# Patient Record
Sex: Female | Born: 1990 | Hispanic: Yes | Marital: Single | Smoking: Never smoker
Health system: Southern US, Community
[De-identification: ages and names within clinical notes are randomized; demographics above are authoritative.]

## PROBLEM LIST (undated history)

## (undated) DIAGNOSIS — J45909 Unspecified asthma, uncomplicated: Secondary | ICD-10-CM

## (undated) DIAGNOSIS — K219 Gastro-esophageal reflux disease without esophagitis: Secondary | ICD-10-CM

## (undated) DIAGNOSIS — N189 Chronic kidney disease, unspecified: Secondary | ICD-10-CM

## (undated) HISTORY — PX: FINGER SURGERY: SHX640

## (undated) HISTORY — DX: Gastro-esophageal reflux disease without esophagitis: K21.9

---

## 2016-03-30 ENCOUNTER — Other Ambulatory Visit (HOSPITAL_COMMUNITY): Payer: Self-pay | Admitting: *Deleted

## 2016-03-30 ENCOUNTER — Encounter (HOSPITAL_COMMUNITY): Payer: Self-pay | Admitting: *Deleted

## 2016-03-30 DIAGNOSIS — N6320 Unspecified lump in the left breast, unspecified quadrant: Secondary | ICD-10-CM

## 2016-04-23 ENCOUNTER — Ambulatory Visit (HOSPITAL_COMMUNITY)
Admission: RE | Admit: 2016-04-23 | Discharge: 2016-04-23 | Disposition: A | Discharge status: Home / Self Care | Payer: Self-pay | Source: Ambulatory Visit | Attending: Obstetrics and Gynecology | Admitting: Obstetrics and Gynecology

## 2016-04-23 ENCOUNTER — Encounter (HOSPITAL_COMMUNITY): Payer: Self-pay

## 2016-04-23 ENCOUNTER — Ambulatory Visit
Admission: RE | Admit: 2016-04-23 | Discharge: 2016-04-23 | Disposition: A | Discharge status: Home / Self Care | Payer: Self-pay | Source: Ambulatory Visit | Attending: Obstetrics and Gynecology | Admitting: Obstetrics and Gynecology

## 2016-04-23 VITALS — BP 108/62 | Temp 98.0°F | Ht 62.0 in | Wt 162.6 lb

## 2016-04-23 DIAGNOSIS — Z1239 Encounter for other screening for malignant neoplasm of breast: Secondary | ICD-10-CM

## 2016-04-23 DIAGNOSIS — N6320 Unspecified lump in the left breast, unspecified quadrant: Secondary | ICD-10-CM

## 2016-04-23 NOTE — Patient Instructions (Signed)
Educational materials on self breast awareness given. Explained to Sander Radon that she did not need a Pap smear today due to last Pap smear was 03/20/2016.  Let her know BCCCP will cover Pap smears every 3 years unless has a history of abnormal Pap smears. Referred patient to the [facility] for a left breast ultrasound. Appointment scheduled for Thursday, Apr 23, 2016 at 1600. [place] verbalized understanding.  Claxton Levitz, Arvil Chaco, RN 4:09 PM

## 2016-04-23 NOTE — Progress Notes (Addendum)
Complaints of left breast lump x 3 years that hasn't changed in size. Patient complained of pain within the the left breast where lump is during her menstrual cycle and when she gets stressed.   Pap Smear:  Pap smear not completed today. Last Pap smear was 03/20/2016 at the [facility] Department and normal. Per patient has no history of an abnormal Pap smear. Last Pap smear result is in EPIC.  Physical exam: Breasts Breasts symmetrical. No skin abnormalities bilateral breasts. No nipple retraction bilateral breasts. No nipple discharge bilateral breasts. No lymphadenopathy. No lumps palpated right breast. Palpated a lump within the left breast at 12 o'clock 5 cm from the nipple. Complaints of tenderness when palpated lump. Referred patient to the [facility] for a left breast ultrasound. Appointment scheduled for Thursday, Apr 23, 2016 at 1600.   Pelvic/Bimanual No Pap smear completed today since last Pap smear was 03/20/2016. Pap smear not indicated per BCCCP guidelines.   Smoking History: Patient has never smoked.  Patient Navigation: Patient education provided. Access to services provided for patient through [facility] program. Spanish interpreter provided.    Used Spanish interpreter Windle Guard from [place].

## 2016-04-24 ENCOUNTER — Other Ambulatory Visit: Payer: Self-pay

## 2016-05-01 ENCOUNTER — Encounter (HOSPITAL_COMMUNITY): Payer: Self-pay | Admitting: *Deleted

## 2016-05-27 NOTE — Addendum Note (Signed)
Encounter addended by: Loletta Parish, RN on: 05/27/2016 10:34 AM<BR>     Documentation filed: Notes Section

## 2017-12-16 ENCOUNTER — Encounter (HOSPITAL_COMMUNITY): Payer: Self-pay | Admitting: Emergency Medicine

## 2017-12-16 ENCOUNTER — Emergency Department (HOSPITAL_COMMUNITY)
Admission: EM | Admit: 2017-12-16 | Discharge: 2017-12-16 | Disposition: A | Discharge status: Home / Self Care | Payer: No Typology Code available for payment source | Attending: Emergency Medicine | Admitting: Emergency Medicine

## 2017-12-16 ENCOUNTER — Other Ambulatory Visit: Payer: Self-pay

## 2017-12-16 DIAGNOSIS — R112 Nausea with vomiting, unspecified: Secondary | ICD-10-CM

## 2017-12-16 DIAGNOSIS — K529 Noninfective gastroenteritis and colitis, unspecified: Secondary | ICD-10-CM

## 2017-12-16 DIAGNOSIS — R197 Diarrhea, unspecified: Secondary | ICD-10-CM

## 2017-12-16 LAB — URINALYSIS, ROUTINE W REFLEX MICROSCOPIC
Glucose, UA: NEGATIVE mg/dL
HGB URINE DIPSTICK: NEGATIVE
KETONES UR: 15 mg/dL — AB
Nitrite: POSITIVE — AB
PROTEIN: NEGATIVE mg/dL
Specific Gravity, Urine: >1.03 — ABNORMAL HIGH (ref 1.005–1.030)
pH: 6 (ref 5.0–8.0)

## 2017-12-16 LAB — URINALYSIS, MICROSCOPIC (REFLEX)

## 2017-12-16 LAB — COMPREHENSIVE METABOLIC PANEL
ALK PHOS: 73 U/L (ref 38–126)
ALT: 100 U/L — AB (ref 14–54)
AST: 52 U/L — AB (ref 15–41)
Albumin: 4.3 g/dL (ref 3.5–5.0)
Anion gap: 10 (ref 5–15)
BUN: 5 mg/dL — AB (ref 6–20)
CALCIUM: 9.7 mg/dL (ref 8.9–10.3)
CHLORIDE: 105 mmol/L (ref 101–111)
CO2: 24 mmol/L (ref 22–32)
CREATININE: 0.69 mg/dL (ref 0.44–1.00)
Glucose, Bld: 88 mg/dL (ref 65–99)
Potassium: 3.7 mmol/L (ref 3.5–5.1)
Sodium: 139 mmol/L (ref 135–145)
Total Bilirubin: 1.2 mg/dL (ref 0.3–1.2)
Total Protein: 7.9 g/dL (ref 6.5–8.1)

## 2017-12-16 LAB — CBC
HCT: 40.7 % (ref 36.0–46.0)
Hemoglobin: 13.1 g/dL (ref 12.0–15.0)
MCH: 29.6 pg (ref 26.0–34.0)
MCHC: 32.2 g/dL (ref 30.0–36.0)
MCV: 91.9 fL (ref 78.0–100.0)
PLATELETS: 368 10*3/uL (ref 150–400)
RBC: 4.43 MIL/uL (ref 3.87–5.11)
RDW: 12.7 % (ref 11.5–15.5)
WBC: 5.7 10*3/uL (ref 4.0–10.5)

## 2017-12-16 LAB — I-STAT BETA HCG BLOOD, ED (MC, WL, AP ONLY)

## 2017-12-16 LAB — LIPASE, BLOOD: LIPASE: 28 U/L (ref 11–51)

## 2017-12-16 MED ORDER — SODIUM CHLORIDE 0.9 % IV BOLUS (SEPSIS)
1000.0000 mL | Freq: Once | INTRAVENOUS | Status: AC
  Administered 2017-12-16: 1000 mL via INTRAVENOUS

## 2017-12-16 MED ORDER — ONDANSETRON 4 MG PO TBDP: ORAL_TABLET | ORAL | 0 refills | Status: DC

## 2017-12-16 MED ORDER — OMEPRAZOLE 20 MG PO CPDR: 20.0000 mg | DELAYED_RELEASE_CAPSULE | Freq: Every day | ORAL | 0 refills | Status: DC

## 2017-12-16 MED ORDER — PANTOPRAZOLE SODIUM 40 MG IV SOLR
40.0000 mg | Freq: Once | INTRAVENOUS | Status: AC
  Administered 2017-12-16: 40 mg via INTRAVENOUS
  Filled 2017-12-16: qty 40

## 2017-12-16 MED ORDER — GI COCKTAIL ~~LOC~~
30.0000 mL | Freq: Once | ORAL | Status: AC
  Administered 2017-12-16: 30 mL via ORAL
  Filled 2017-12-16: qty 30

## 2017-12-16 MED ORDER — ONDANSETRON HCL 4 MG/2ML IJ SOLN
4.0000 mg | Freq: Once | INTRAMUSCULAR | Status: AC
  Administered 2017-12-16: 4 mg via INTRAVENOUS
  Filled 2017-12-16: qty 2

## 2017-12-16 NOTE — ED Provider Notes (Signed)
[facility] EMERGENCY DEPARTMENT Provider Note   CSN: [id number] Arrival date & time: 12/16/17  6144     History   Chief Complaint Chief Complaint  Patient presents with  . Emesis  . Abdominal Pain    HPI  Alexa Alvarez is a 27 y.o. Female otherwise healthy, presents to the ED complaining of 4 days of nausea, vomiting and no appetite, patient reports some associated nonbloody diarrhea, denies any melena.  Reports yesterday and today she started to have epigastric burning and pain, denies any abdominal pain elsewhere.  That has not done anything to treat her symptoms at home, has been able to drink fluids but has not tried to eat anything in the past 4 days.  Unsure if symptoms are made worse with food as patient really has not tried to eat.  Patient denies any lower abdominal pain, denies any burning or discomfort with urination.  Patient denies any vaginal discharge.  Reports she is been having regular periods.  Denies any associated chest pain or shortness of breath, no fevers, cough, rhinorrhea or sore throat.      History reviewed. No pertinent past medical history.  There are no active problems to display for this patient.   Past Surgical History:  Procedure Laterality Date  . CESAREAN SECTION      OB History    Gravida Para Term Preterm AB Living   1 1 1     1    SAB TAB Ectopic Multiple Live Births                   Home Medications    Prior to Admission medications   Not on File    Family History Family History  Problem Relation Age of Onset  . Diabetes Mother   . Hypertension Mother     Social History Social History   Tobacco Use  . Smoking status: Never Smoker  . Smokeless tobacco: Never Used  Substance Use Topics  . Alcohol use: No  . Drug use: No     Allergies   Patient has no known allergies.   Review of Systems Review of Systems  Constitutional: Positive for appetite change and fatigue. Negative for  chills and fever.  HENT: Negative for congestion, rhinorrhea and sore throat.   Eyes: Negative for discharge, redness and itching.  Respiratory: Negative for cough, chest tightness and shortness of breath.   Cardiovascular: Negative for chest pain, palpitations and leg swelling.  Gastrointestinal: Positive for abdominal pain, diarrhea, nausea and vomiting. Negative for blood in stool.  Genitourinary: Negative for dysuria, frequency, pelvic pain, vaginal bleeding, vaginal discharge and vaginal pain.  Musculoskeletal: Negative for arthralgias and myalgias.  Skin: Negative for rash.  Neurological: Negative for dizziness, weakness and light-headedness.     Physical Exam Updated Vital Signs BP 123/84 (BP Location: Right Arm)   Pulse 67   Temp 97.7 F (36.5 C) (Oral)   Resp 16   LMP 11/24/2017 (Exact Date)   SpO2 100%   Physical Exam  Constitutional: She appears well-developed and well-nourished. No distress.  HENT:  Head: Normocephalic and atraumatic.  Eyes: Right eye exhibits no discharge. Left eye exhibits no discharge.  Cardiovascular: Normal rate, regular rhythm and normal heart sounds.  Pulmonary/Chest: Effort normal and breath sounds normal. No respiratory distress.  Abdominal: Soft. Bowel sounds are normal. She exhibits no distension and no mass. There is no tenderness. There is no guarding.  No tenderness on palpation of all 4 quadrants  of the abdomen abdomen is soft and nondistended, specifically no tenderness on palpation of the epigastrium, no peritoneal signs or CVA tenderness  Musculoskeletal: She exhibits no edema or deformity.  Neurological: She is alert. Coordination normal.  Skin: Skin is warm and dry. Capillary refill takes less than 2 seconds. She is not diaphoretic.  Psychiatric: She has a normal mood and affect. Her behavior is normal.  Nursing note and vitals reviewed.    ED Treatments / Results  Labs (all labs ordered are listed, but only abnormal results  are displayed) Labs Reviewed  COMPREHENSIVE METABOLIC PANEL - Abnormal; Notable for the following components:      Result Value   BUN 5 (*)    AST 52 (*)    ALT 100 (*)    All other components within normal limits  URINALYSIS, ROUTINE W REFLEX MICROSCOPIC - Abnormal; Notable for the following components:   APPearance CLOUDY (*)    Specific Gravity, Urine >1.030 (*)    Bilirubin Urine SMALL (*)    Ketones, ur 15 (*)    Nitrite POSITIVE (*)    Leukocytes, UA SMALL (*)    All other components within normal limits  URINALYSIS, MICROSCOPIC (REFLEX) - Abnormal; Notable for the following components:   Bacteria, UA MANY (*)    Squamous Epithelial / LPF 6-30 (*)    All other components within normal limits  LIPASE, BLOOD  CBC  I-STAT BETA HCG BLOOD, ED ([facility], [facility], [facility] ONLY)    EKG  EKG Interpretation None       Radiology No results found.  Procedures Procedures (including critical care time)  Medications Ordered in ED Medications  sodium chloride 0.9 % bolus 1,000 mL (0 mLs Intravenous Stopped 12/16/17 1553)  pantoprazole (PROTONIX) injection 40 mg (40 mg Intravenous Given 12/16/17 1220)  ondansetron (ZOFRAN) injection 4 mg (4 mg Intravenous Given 12/16/17 1220)  gi cocktail (Maalox,Lidocaine,Donnatal) (30 mLs Oral Given 12/16/17 1221)     Initial Impression / Assessment and Plan / ED Course  I have reviewed the triage vital signs and the nursing notes.  Pertinent labs & imaging results that were available during my care of the patient were reviewed by me and considered in my medical decision making (see chart for details).  Patient presents with 4 days of decreased appetite, nausea, vomiting and diarrhea.  With some epigastric burning that is intermittent.  No medications to treat symptoms at home.  Vitals are normal and patient is well-appearing.  Abdomen is benign with no tenderness on exam no peritoneal signs.  No urinary symptoms, lower abdominal tenderness vaginal discharge  or pelvic pain. Likely viral gastroenteritis given reassuring labs.  No leukocytosis and hemoglobin is normal, no electrolyte derangements, kidney function normal, liver function studies are slightly increased from baseline but patient has no right upper quadrant tenderness will have patient follow-up with her primary doctor in a week to have these rechecked.  Urinalysis does not appear to be a clean catch, small amount of nitrates and leukocytes, but without any urinary symptoms will not treat as UTI.  Lipase normal.  Beta hCG negative.  Will treat with IV fluids, Zofran, Protonix and GI cocktail and reevaluate the patient.  Patient reports complete resolution of symptoms after fluids, Zofran, Protonix and GI cocktail.  Abdomen continues to be soft and nontender to palpation.  Presentation suggestive of gastroenteritis and labs are reassuring.  Will give patient p.o. trial if she is able to keep down food and liquids patient is stable for  discharge home and follow-up with her primary care doctor.  Strict return precautions discussed.  Patient expresses understanding and is in agreement with plan  Final Clinical Impressions(s) / ED Diagnoses   Final diagnoses:  Gastroenteritis  Nausea vomiting and diarrhea    ED Discharge Orders        Ordered    omeprazole (PRILOSEC) 20 MG capsule  Daily     12/16/17 1544    ondansetron (ZOFRAN ODT) 4 MG disintegrating tablet     12/16/17 1544       Benedetto Goad [place], [place] 12/17/17 9692    Fredia Sorrow, MD 12/18/17 [phone]

## 2017-12-16 NOTE — Discharge Instructions (Signed)
Your evaluation today has been reassuring, likely symptoms are caused by viral gastroenteritis or gastric reflux.  Please follow-up with primary doctor and [facility] or use the phone number provided to establish a PCP, you will need to have your liver function rechecked in about a week to ensure this has normalized.  Please start taking omeprazole daily, you may use Zofran as needed for nausea.  You may also use Culturelle this is an over-the-counter probiotic to help with diarrhea.  If your symptoms persist and you are unable to keep down liquids or food, or you have worsening focal abdominal pain, fevers or blood in the stool please return to the ED for reevaluation.

## 2017-12-16 NOTE — ED Triage Notes (Signed)
Pt reports no appetite x 4 days, reports diarrhea and some n/v, reports burning pain to stomach since yesterday.

## 2018-06-06 ENCOUNTER — Other Ambulatory Visit (HOSPITAL_COMMUNITY): Payer: Self-pay | Admitting: *Deleted

## 2018-06-06 DIAGNOSIS — N6320 Unspecified lump in the left breast, unspecified quadrant: Secondary | ICD-10-CM

## 2018-06-28 ENCOUNTER — Encounter (HOSPITAL_COMMUNITY): Payer: Self-pay

## 2018-06-28 ENCOUNTER — Ambulatory Visit
Admission: RE | Admit: 2018-06-28 | Discharge: 2018-06-28 | Disposition: A | Discharge status: Home / Self Care | Payer: No Typology Code available for payment source | Source: Ambulatory Visit | Attending: Obstetrics and Gynecology | Admitting: Obstetrics and Gynecology

## 2018-06-28 ENCOUNTER — Ambulatory Visit (HOSPITAL_COMMUNITY)
Admission: RE | Admit: 2018-06-28 | Discharge: 2018-06-28 | Disposition: A | Discharge status: Home / Self Care | Payer: No Typology Code available for payment source | Source: Ambulatory Visit | Attending: Obstetrics and Gynecology | Admitting: Obstetrics and Gynecology

## 2018-06-28 VITALS — BP 110/68 | Wt 153.2 lb

## 2018-06-28 DIAGNOSIS — N6320 Unspecified lump in the left breast, unspecified quadrant: Secondary | ICD-10-CM

## 2018-06-28 DIAGNOSIS — Z1239 Encounter for other screening for malignant neoplasm of breast: Secondary | ICD-10-CM

## 2018-06-28 NOTE — Progress Notes (Signed)
Complaints of two left breast lumps with one at 11 o'clock x 5 years and one at 4 o'clock x 2 weeks.   Pap Smear: Pap smear not completed today. Last Pap smear was 03/20/2016 at the [facility] Department and normal. Per patient has no history of an abnormal Pap smear. Last Pap smear result is in EPIC.  Physical exam: Breasts Breasts symmetrical. No skin abnormalities bilateral breasts. No nipple retraction bilateral breasts. No nipple discharge bilateral breasts. No lymphadenopathy. No lumps palpated within the right breast. Palpated two lumps within the left breast at 11:30 o'clock 4 cm from the nipple and at 4 o'clock 2 cm from the nipple. No complaints of pain or tenderness on exam. Referred patient to the [facility] for a left breast ultrasound. Appointment scheduled for Tuesday, June 28, 2018 at 1500.        Pelvic/Bimanual No Pap smear completed today since last Pap smear was 03/20/2016. Pap smear not indicated per BCCCP guidelines.   Smoking History: Patient has never smoked.  Patient Navigation: Patient education provided. Access to services provided for patient through [facility] program. Spanish interpreter provided.   Breast and Cervical Cancer Risk Assessment: Patient has no family history of breast cancer, known genetic mutations, or radiation treatment to the chest before age 37. Patient has no history of cervical dysplasia, immunocompromised, or DES exposure in-utero. Breast Cancer risk assessment completed. Unable to calculate patients breast cancer risk due to being under age 71.  Used Spanish interpreter [employer] from [place].

## 2018-06-28 NOTE — Patient Instructions (Signed)
Explained breast self awareness with Sander Radon. Patient did not need a Pap smear today due to last Pap smear was 03/20/2016. Let her know BCCCP will cover Pap smears every 3 years unless has a history of abnormal Pap smears and that her next Pap smear is due in April 2020. Referred patient to the [facility] for a left breast ultrasound. Appointment scheduled for Tuesday, June 28, 2018 at 1500. [place] verbalized understanding.  Malik Ruffino, Arvil Chaco, RN 12:46 PM

## 2018-07-01 ENCOUNTER — Encounter (HOSPITAL_COMMUNITY): Payer: Self-pay | Admitting: *Deleted

## 2018-11-17 ENCOUNTER — Encounter: Payer: Self-pay | Admitting: Family Medicine

## 2018-11-17 ENCOUNTER — Ambulatory Visit: Payer: Self-pay | Attending: Family Medicine | Admitting: Family Medicine

## 2018-11-17 VITALS — BP 137/82 | HR 64 | Temp 98.5°F | Resp 18 | Ht 64.0 in | Wt 162.0 lb

## 2018-11-17 DIAGNOSIS — K219 Gastro-esophageal reflux disease without esophagitis: Secondary | ICD-10-CM

## 2018-11-17 DIAGNOSIS — N3000 Acute cystitis without hematuria: Secondary | ICD-10-CM

## 2018-11-17 DIAGNOSIS — R350 Frequency of micturition: Secondary | ICD-10-CM

## 2018-11-17 DIAGNOSIS — R42 Dizziness and giddiness: Secondary | ICD-10-CM

## 2018-11-17 DIAGNOSIS — R5383 Other fatigue: Secondary | ICD-10-CM | POA: Insufficient documentation

## 2018-11-17 DIAGNOSIS — D649 Anemia, unspecified: Secondary | ICD-10-CM

## 2018-11-17 DIAGNOSIS — R110 Nausea: Secondary | ICD-10-CM

## 2018-11-17 DIAGNOSIS — Z833 Family history of diabetes mellitus: Secondary | ICD-10-CM

## 2018-11-17 LAB — POCT URINALYSIS DIP (CLINITEK)
Bilirubin, UA: NEGATIVE
Blood, UA: NEGATIVE
Glucose, UA: NEGATIVE mg/dL
Ketones, POC UA: NEGATIVE mg/dL
Nitrite, UA: POSITIVE — AB
Spec Grav, UA: 1.02
Urobilinogen, UA: 1 U/dL
pH, UA: 7

## 2018-11-17 LAB — POCT GLYCOSYLATED HEMOGLOBIN (HGB A1C): Hemoglobin A1C: 5.3 % (ref 4.0–5.6)

## 2018-11-17 LAB — GLUCOSE, POCT (MANUAL RESULT ENTRY): POC Glucose: 80 mg/dL (ref 70–99)

## 2018-11-17 MED ORDER — SULFAMETHOXAZOLE-TRIMETHOPRIM 800-160 MG PO TABS: 1.0000 | ORAL_TABLET | Freq: Two times a day (BID) | ORAL | 0 refills | Status: AC

## 2018-11-17 MED ORDER — OMEPRAZOLE 20 MG PO CPDR: 20.0000 mg | DELAYED_RELEASE_CAPSULE | Freq: Every day | ORAL | 3 refills | Status: DC

## 2018-11-17 NOTE — Progress Notes (Signed)
Subjective:    Patient ID: Alexa Alvarez, female    DOB: 10-11-1991, 27 y.o.   MRN: [id number]  HPI        27 yo female new to the practice. Patient reports that she has had recurrent issues with nausea as well as dizziness and feeling light-headed. Patient reports that she has a family history of diabetes and that she would like to be checked for diabetes. Patient has had some urinary frequency at times and lately has noticed a strong odor to her urine at times and sometimes her urine is dark.  Patient reports that her mother, as well as aunts and cousins have diabetes. Patient denies any issues with gestational diabetes.        Patient reports a past medical history of asthma in childhood but no symptoms as an adult. Patient has had anemia in the past. Patient only prescription medication at this time is birth control pills. Patient reports that she is in a committed relationship. She does not drink or smoke. Her only past surgery has been a C-section.       Patient has found that her nausea usuallly occurs when she lies down at night or after eating spicy or greasy foods. Patient does get some burping and belching at times and when lying down at night she gets backwash of fluid into her throat. She denies any abdominal pain and no chest pain.     Review of Systems  Constitutional: Positive for fatigue. Negative for chills and fever.  HENT: Negative for congestion, sore throat and trouble swallowing.   Eyes: Negative for photophobia and visual disturbance.  Respiratory: Negative for cough and shortness of breath.   Cardiovascular: Negative for chest pain, palpitations and leg swelling.  Gastrointestinal: Positive for nausea and vomiting. Negative for abdominal pain, constipation and diarrhea.  Endocrine: Positive for polyuria. Negative for polydipsia and polyphagia.  Genitourinary: Positive for frequency. Negative for dysuria and flank pain.  Musculoskeletal: Positive for back pain  (occasional). Negative for gait problem.  Neurological: Negative for dizziness and headaches.  Hematological: Negative for adenopathy. Does not bruise/bleed easily.       Objective:   Physical Exam Vitals signs and nursing note reviewed.  Constitutional:      Appearance: Normal appearance. She is normal weight.  HENT:     Right Ear: Tympanic membrane normal.     Left Ear: Tympanic membrane normal.     Nose: Nose normal.     Mouth/Throat:     Mouth: Mucous membranes are moist.     Pharynx: Oropharynx is clear. No oropharyngeal exudate.  Neck:     Musculoskeletal: Normal range of motion and neck supple. No muscular tenderness.  Cardiovascular:     Rate and Rhythm: Normal rate and regular rhythm.  Pulmonary:     Effort: Pulmonary effort is normal.     Breath sounds: Normal breath sounds.  Abdominal:     General: Abdomen is flat.     Palpations: Abdomen is soft.     Tenderness: There is no abdominal tenderness. There is no right CVA tenderness or left CVA tenderness.  Musculoskeletal: Normal range of motion.        General: No swelling or tenderness.     Right lower leg: No edema.     Left lower leg: No edema.  Lymphadenopathy:     Cervical: No cervical adenopathy.  Skin:    Findings: No bruising or rash.  Neurological:     General: No  focal deficit present.     Mental Status: She is alert.     Cranial Nerves: No cranial nerve deficit.    BP 137/82 (BP Location: Left Arm, Patient Position: Sitting, Cuff Size: Normal)   Pulse 64   Temp 98.5 F (36.9 C) (Oral)   Resp 18   Ht 5\' 4"  (1.626 m)   Wt 162 lb (73.5 kg)   LMP 10/31/2018   SpO2 100%   BMI 27.81 kg/m         Assessment & Plan:  1. Gastroesophageal reflux disease, esophagitis presence not specified Based on patient's symptoms she likely has acid reflux as the cause of her nausea. Prescription provided for patient to start once per day omeprazole 20 mg and discussed avoidance of spicy/greasy foods and foods  which tend to trigger her symptoms as well as avoidance of late night eating  2. Anemia, unspecified type Patient reports fatigue and a history of anemia. Will check CBC and notify patient if any therapy is needed based on the results - CBC with Differential  3. Acute cystitis without hematuria UA done due to patient's complaint's of urinary frequency, increased odor and dark color to the urine. Patient with abnormal [place] and will be placed on Septra DS twice per day for 3 days and is encouraged to remain well hydrated. - sulfamethoxazole-trimethoprim (BACTRIM DS,SEPTRA DS) 800-160 MG tablet; Take 1 tablet by mouth 2 (two) times daily for 3 days. To treat bladder infection  Dispense: 6 tablet; Refill: 0  4. Urinary frequency Patient  reports urinary frequency and a family history of DM and was asked to give a sample for a UA as well as Hgb A1c and glucose.  Patient with normal random glucose of 80 and normal Hgb A1c of 5.3. - POCT URINALYSIS DIP (CLINITEK) - HgB A1c - Glucose (CBG)  5. Family history of diabetes mellitus Patient reports a family history of diabetes and wanted to be checked for DM due to fatigue and urinary frequency. Patient with normal A1c of 5.3 and normal random glucose of 80 and is not diabetic.  - HgB A1c - Glucose (CBG)  An After Visit Summary was printed and given to the patient.  Return in about 6 weeks (around 12/29/2018) for GERD.

## 2018-11-17 NOTE — Patient Instructions (Signed)
Enfermedad de reflujo gastroesofgico en los adultos Gastroesophageal Reflux Disease, Adult El reflujo gastroesofgico (RGE) ocurre cuando el cido del estmago sube por el tubo que conecta la boca con el estmago (esfago). Normalmente, la comida baja por el esfago y se mantiene en el estmago, donde se la digiere. Cuando una persona tiene RGE, los alimentos y el cido estomacal suelen volver al esfago. Usted puede tener una enfermedad llamada enfermedad de reflujo gastroesofgico (ERGE) si el reflujo:  Sucede a menudo.  Causa sntomas frecuentes o muy intensos.  Causa problemas tales como dao en el esfago. Cuando esto ocurre, el esfago duele y se hincha (inflama). Con el tiempo, la ERGE puede ocasionar pequeos agujeros (lceras) en el revestimiento del esfago. Cules son las causas? Esta afeccin se debe a un problema en el msculo que se encuentra entre el esfago y [place]. Cuando este msculo est dbil o no es normal, no se cierra correctamente para impedir que los alimentos y el cido regresen del Paramedic. El msculo puede debilitarse debido a lo siguiente:  El consumo de [place].  [place].  Tener cierto tipo de hernia (hernia de hiato).  Consumo de alcohol.  Ciertos alimentos y bebidas, como caf, chocolate, cebollas y [place]. Qu incrementa el riesgo? Es ms probable que tenga esta afeccin si:  Tiene sobrepeso.  Tiene una enfermedad que afecta el tejido conjuntivo.  [place] antiinflamatorios no esteroideos (AINE). Cules son los signos o los sntomas? Los sntomas de esta afeccin incluyen:  Acidez estomacal.  Dificultad o dolor al tragar.  Sensacin de Best boy un bulto en la garganta.  Sabor amargo en la boca.  Mal aliento.  Tener una gran cantidad de saliva.  Estmago inflamado o con Tree surgeon.  Eructos.  Dolor en el pecho. El dolor de pecho puede deberse a distintas afecciones. Asegrese de Teacher, adult education a su mdico si tiene Tourist information centre manager.  Falta  de aire o respiracin ruidosa (sibilancias).  Tos constante (crnica) o durante la noche.  Desgaste de la superficie de los dientes (esmalte dental).  Prdida de peso. Cmo se trata? El tratamiento depender de la gravedad de los sntomas. El mdico puede sugerirle lo siguiente:  Cambios en la dieta.  Medicamentos.  Clementeen Hoof. Siga estas indicaciones en su casa: Comida y bebida   Siga una dieta como se lo haya indicado el mdico. Es posible que deba evitar alimentos y bebidas, por ejemplo: ? Caf y t (con o sin cafena). ? Bebidas que contengan alcohol. ? Bebidas energticas y deportivas. ? Bebidas gaseosas y refrescos. ? Chocolate y cacao. ? Menta y [place]. ? Ajo y cebolla. ? Rbano picante. ? Alimentos cidos y condimentados. Estos incluyen todos los tipos de pimientos, [place] en polvo, curry en polvo, vinagre, salsas picantes y [employer]. ? Ctricos y sus jugos, por ejemplo, naranjas, limones y limas. ? Alimentos que [employer]. Estos incluyen salsa roja, [place], salsa picante y pizza con salsa de [place]. ? Alimentos fritos y Radio broadcast assistant. Estos incluyen donas, papas fritas, papitas fritas de bolsa y aderezos con alto contenido de [place]. ? Carnes con alto contenido de [place]. Estas incluye los perros calientes, chuletas o costillas, embutidos, jamn y tocino. ? Productos lcteos ricos en grasas, como leche [place], [place] y [place] crema.  Consuma pequeas cantidades de comida con ms frecuencia. Evite consumir porciones abundantes.  Evite beber grandes cantidades de lquidos con las comidas.  Evite comer 2 o 3horas antes de acostarse.  Evite recostarse inmediatamente despus de comer.  No haga ejercicios  enseguida despus de comer. Estilo de vida   No consuma ningn producto que contenga nicotina o tabaco. Estos incluyen cigarrillos, cigarrillos electrnicos y tabaco para Higher education careers adviser. Si necesita ayuda para dejar de fumar, consulte al [employer].  Intente  reducir [employer] de estrs. Si necesita ayuda para hacer esto, consulte al mdico.  Si tiene sobrepeso, baje una cantidad de peso saludable para usted. Consulte a su mdico para bajar de peso de [employer]. Indicaciones generales  Est atento a cualquier cambio en los sntomas.  Tome los medicamentos de venta libre y los recetados solamente como se lo haya indicado el mdico. No tome aspirina, ibuprofeno ni otros AINE a menos que el mdico lo autorice.  Use ropa holgada. No use nada apretado alrededor de la cintura.  Levante (eleve) la cabecera de la cama aproximadamente 6pulgadas (15cm).  Evite inclinarse si al hacerlo empeoran los sntomas.  Concurra a todas las visitas de seguimiento como se lo haya indicado el mdico. Esto es importante. Comunquese con un mdico si:  Aparecen nuevos sntomas.  Adelgaza y no sabe por qu.  Tiene problemas para tragar o le duele cuando traga.  Tiene sibilancias o tos persistente.  Los sntomas no mejoran con Dispensing optician.  Tiene la voz ronca. Solicite ayuda inmediatamente si:  [employer], el cuello, la [place], los dientes o la espalda.  Se siente transpirado, mareado o tiene una sensacin de desvanecimiento.  Siente falta de aire o Tourist information centre manager.  Vomita y el vmito tiene un aspecto similar a la sangre o a los posos de caf.  Pierde el conocimiento (se desmaya).  Las deposiciones (heces) son sanguinolentas o negras.  No puede tragar, beber o comer. Resumen  Si una persona tiene enfermedad de reflujo gastroesofgico (ERGE), los alimentos y el cido estomacal suben al esfago y causan sntomas o problemas tales como dao en el esfago.  El tratamiento depender de la gravedad de los sntomas.  Siga una Lexicographer se lo haya indicado el [place] todos los medicamentos solamente como se lo haya indicado el mdico. Esta informacin no tiene Marine scientist el consejo del mdico. Asegrese de  hacerle al mdico cualquier pregunta que tenga. Document Released: 12/19/2010 Document Revised: 06/30/2018 Document Reviewed: 06/30/2018 Elsevier Interactive Patient Education  2019 [place] de reflujo gastroesofgico en los adultos Gastroesophageal Reflux Disease, Adult El reflujo gastroesofgico (RGE) ocurre cuando el cido del estmago sube por el tubo que conecta la boca con el estmago (esfago). Normalmente, la comida baja por el esfago y se mantiene en el estmago, donde se la digiere. Cuando una persona tiene RGE, los alimentos y el cido estomacal suelen volver al esfago. Usted puede tener una enfermedad llamada enfermedad de reflujo gastroesofgico (ERGE) si el reflujo:  Sucede a menudo.  Causa sntomas frecuentes o muy intensos.  Causa problemas tales como dao en el esfago. Cuando esto ocurre, el esfago duele y se hincha (inflama). Con el tiempo, la ERGE puede ocasionar pequeos agujeros (lceras) en el revestimiento del esfago. Cules son las causas? Esta afeccin se debe a un problema en el msculo que se encuentra entre el esfago y [place]. Cuando este msculo est dbil o no es normal, no se cierra correctamente para impedir que los alimentos y el cido regresen del Paramedic. El msculo puede debilitarse debido a lo siguiente:  El consumo de [place].  [place].  Tener cierto tipo de hernia (hernia de hiato).  Consumo de alcohol.  Ciertos alimentos  y bebidas, como caf, chocolate, cebollas y [place]. Qu incrementa el riesgo? Es ms probable que tenga esta afeccin si:  Tiene sobrepeso.  Tiene una enfermedad que afecta el tejido conjuntivo.  [place] antiinflamatorios no esteroideos (AINE). Cules son los signos o los sntomas? Los sntomas de esta afeccin incluyen:  Acidez estomacal.  Dificultad o dolor al tragar.  Sensacin de Best boy un bulto en la garganta.  Sabor amargo en la boca.  Mal aliento.  Tener una gran cantidad de  saliva.  Estmago inflamado o con Tree surgeon.  Eructos.  Dolor en el pecho. El dolor de pecho puede deberse a distintas afecciones. Asegrese de Teacher, adult education a su mdico si tiene Tourist information centre manager.  Falta de aire o respiracin ruidosa (sibilancias).  Tos constante (crnica) o durante la noche.  Desgaste de la superficie de los dientes (esmalte dental).  Prdida de peso. Cmo se trata? El tratamiento depender de la gravedad de los sntomas. El mdico puede sugerirle lo siguiente:  Cambios en la dieta.  Medicamentos.  Clementeen Hoof. Siga estas indicaciones en su casa: Comida y bebida   Siga una dieta como se lo haya indicado el mdico. Es posible que deba evitar alimentos y bebidas, por ejemplo: ? Caf y t (con o sin cafena). ? Bebidas que contengan alcohol. ? Bebidas energticas y deportivas. ? Bebidas gaseosas y refrescos. ? Chocolate y cacao. ? Menta y [place]. ? Ajo y cebolla. ? Rbano picante. ? Alimentos cidos y condimentados. Estos incluyen todos los tipos de pimientos, [place] en polvo, curry en polvo, vinagre, salsas picantes y [employer]. ? Ctricos y sus jugos, por ejemplo, naranjas, limones y limas. ? Alimentos que [employer]. Estos incluyen salsa roja, [place], salsa picante y pizza con salsa de [place]. ? Alimentos fritos y Radio broadcast assistant. Estos incluyen donas, papas fritas, papitas fritas de bolsa y aderezos con alto contenido de [place]. ? Carnes con alto contenido de [place]. Estas incluye los perros calientes, chuletas o costillas, embutidos, jamn y tocino. ? Productos lcteos ricos en grasas, como leche [place], [place] y [place] crema.  Consuma pequeas cantidades de comida con ms frecuencia. Evite consumir porciones abundantes.  Evite beber grandes cantidades de lquidos con las comidas.  Evite comer 2 o 3horas antes de acostarse.  Evite recostarse inmediatamente despus de comer.  No haga ejercicios enseguida despus de comer. Estilo de  vida   No consuma ningn producto que contenga nicotina o tabaco. Estos incluyen cigarrillos, cigarrillos electrnicos y tabaco para Higher education careers adviser. Si necesita ayuda para dejar de fumar, consulte al [employer].  Intente reducir [employer] de estrs. Si necesita ayuda para hacer esto, consulte al mdico.  Si tiene sobrepeso, baje una cantidad de peso saludable para usted. Consulte a su mdico para bajar de peso de [employer]. Indicaciones generales  Est atento a cualquier cambio en los sntomas.  Tome los medicamentos de venta libre y los recetados solamente como se lo haya indicado el mdico. No tome aspirina, ibuprofeno ni otros AINE a menos que el mdico lo autorice.  Use ropa holgada. No use nada apretado alrededor de la cintura.  Levante (eleve) la cabecera de la cama aproximadamente 6pulgadas (15cm).  Evite inclinarse si al hacerlo empeoran los sntomas.  Concurra a todas las visitas de seguimiento como se lo haya indicado el mdico. Esto es importante. Comunquese con un mdico si:  Aparecen nuevos sntomas.  Adelgaza y no sabe por qu.  Tiene problemas para tragar o le duele cuando traga.  Tiene sibilancias o tos persistente.  Los sntomas no mejoran con Dispensing optician.  Tiene la voz ronca. Solicite ayuda inmediatamente si:  [employer], el cuello, la [place], los dientes o la espalda.  Se siente transpirado, mareado o tiene una sensacin de desvanecimiento.  Siente falta de aire o Tourist information centre manager.  Vomita y el vmito tiene un aspecto similar a la sangre o a los posos de caf.  Pierde el conocimiento (se desmaya).  Las deposiciones (heces) son sanguinolentas o negras.  No puede tragar, beber o comer. Resumen  Si una persona tiene enfermedad de reflujo gastroesofgico (ERGE), los alimentos y el cido estomacal suben al esfago y causan sntomas o problemas tales como dao en el esfago.  El tratamiento depender de la gravedad de los  sntomas.  Siga una Lexicographer se lo haya indicado el [place] todos los medicamentos solamente como se lo haya indicado el mdico. Esta informacin no tiene Marine scientist el consejo del mdico. Asegrese de hacerle al mdico cualquier pregunta que tenga. Document Released: 12/19/2010 Document Revised: 06/30/2018 Document Reviewed: 06/30/2018 Elsevier Interactive Patient Education  2019 [employer].

## 2018-11-18 LAB — CBC WITH DIFFERENTIAL/PLATELET
Basophils Absolute: 0 x10E3/uL (ref 0.0–0.2)
Basos: 0 %
EOS (ABSOLUTE): 0.1 x10E3/uL (ref 0.0–0.4)
Eos: 1 %
Hematocrit: 39.2 % (ref 34.0–46.6)
Hemoglobin: 12.9 g/dL (ref 11.1–15.9)
Immature Grans (Abs): 0 x10E3/uL (ref 0.0–0.1)
Immature Granulocytes: 0 %
Lymphocytes Absolute: 2.6 x10E3/uL (ref 0.7–3.1)
Lymphs: 41 %
MCH: 29.3 pg (ref 26.6–33.0)
MCHC: 32.9 g/dL (ref 31.5–35.7)
MCV: 89 fL (ref 79–97)
Monocytes Absolute: 0.5 x10E3/uL (ref 0.1–0.9)
Monocytes: 8 %
Neutrophils Absolute: 3.1 x10E3/uL (ref 1.4–7.0)
Neutrophils: 50 %
Platelets: 378 x10E3/uL (ref 150–450)
RBC: 4.4 x10E6/uL (ref 3.77–5.28)
RDW: 11.8 % — ABNORMAL LOW (ref 12.3–15.4)
WBC: 6.3 x10E3/uL (ref 3.4–10.8)

## 2018-12-29 ENCOUNTER — Ambulatory Visit: Payer: Self-pay | Attending: Family Medicine | Admitting: Family Medicine

## 2018-12-29 ENCOUNTER — Encounter: Payer: Self-pay | Admitting: Family Medicine

## 2018-12-29 VITALS — BP 106/74 | HR 82 | Temp 98.0°F | Ht 64.0 in | Wt 158.0 lb

## 2018-12-29 DIAGNOSIS — R42 Dizziness and giddiness: Secondary | ICD-10-CM

## 2018-12-29 DIAGNOSIS — K219 Gastro-esophageal reflux disease without esophagitis: Secondary | ICD-10-CM

## 2018-12-29 DIAGNOSIS — R748 Abnormal levels of other serum enzymes: Secondary | ICD-10-CM

## 2018-12-29 NOTE — Progress Notes (Signed)
Follow up GERD, Patient denies any discomfort. Feels fine

## 2018-12-29 NOTE — Progress Notes (Signed)
Subjective:    Patient ID: Alexa Alvarez, female    DOB: [birth year], 28 y.o.   MRN: [id number]  HPI       28 yo female seen in follow-up of GERD which she reports has improved with the use of omeprazole. Patient also had elevated liver enzymes on blood work done in Jan of 2019 when she was seen at the ED due to the complaint of nausea and vomiting and was diagnosed with gastroenteritis.  Patient denies any recent abdominal pain, no nausea and vomiting and no blood in the stool or dark stools.  Patient has had no diarrhea, loose stools and no light or clay colored stools.    Patient at today's visit has new complaint of recurrent dizziness.  Patient sometimes feels as if she is off balance when she has onset of dizziness.  Patient denies any ear pain, no nasal congestion and no headaches.  Patient reports that the dizziness occurs randomly.  Patient denies any episodes of focal numbness or weakness and no visual changes.  Patient has had no falls and no history of injury to the head or neck.  Past Medical History:  Diagnosis Date  . GERD (gastroesophageal reflux disease)    Past Surgical History:  Procedure Laterality Date  . CESAREAN SECTION     Family History  Problem Relation Age of Onset  . Diabetes Mother   . Hypertension Mother    Social History   Tobacco Use  . Smoking status: Never Smoker  . Smokeless tobacco: Never Used  Substance Use Topics  . Alcohol use: No  . Drug use: No   No Known Allergies    Review of Systems  Constitutional: Negative for chills, fatigue and fever.  HENT: Negative for congestion, ear pain, postnasal drip, rhinorrhea, sore throat and trouble swallowing.   Eyes: Negative for photophobia and visual disturbance.  Respiratory: Negative for cough and shortness of breath.   Cardiovascular: Negative for chest pain.  Gastrointestinal: Negative for abdominal pain, blood in stool, constipation, diarrhea, nausea and vomiting.  Endocrine:  Negative for cold intolerance, heat intolerance, polydipsia, polyphagia and polyuria.  Genitourinary: Negative for dysuria and frequency.  Musculoskeletal: Negative for arthralgias, gait problem and myalgias.  Neurological: Positive for dizziness. Negative for tremors, syncope, facial asymmetry, speech difficulty, weakness, light-headedness, numbness and headaches.  Hematological: Negative for adenopathy. Does not bruise/bleed easily.  Psychiatric/Behavioral: Negative for sleep disturbance. The patient is not nervous/anxious.        Objective:   Physical Exam Vitals signs and nursing note reviewed.  Constitutional:      General: She is not in acute distress.    Appearance: Normal appearance.  HENT:     Head: Normocephalic and atraumatic.     Right Ear: Tympanic membrane, ear canal and external ear normal.     Left Ear: Tympanic membrane, ear canal and external ear normal.     Nose: Nose normal. No congestion or rhinorrhea.     Mouth/Throat:     Mouth: Mucous membranes are moist.     Pharynx: Oropharynx is clear.  Eyes:     Extraocular Movements: Extraocular movements intact.     Conjunctiva/sclera: Conjunctivae normal.     Pupils: Pupils are equal, round, and reactive to light.  Neck:     Musculoskeletal: Normal range of motion and neck supple. No muscular tenderness.     Vascular: No carotid bruit.  Cardiovascular:     Rate and Rhythm: Normal rate and regular rhythm.  Pulmonary:  Effort: Pulmonary effort is normal.     Breath sounds: Normal breath sounds.  Abdominal:     General: There is no distension.     Palpations: Abdomen is soft.     Tenderness: There is no abdominal tenderness. There is no right CVA tenderness, left CVA tenderness, guarding or rebound.  Musculoskeletal:        General: No tenderness.     Right lower leg: No edema.     Left lower leg: No edema.  Lymphadenopathy:     Cervical: No cervical adenopathy.  Skin:    General: Skin is warm and dry.    Neurological:     General: No focal deficit present.     Mental Status: She is alert and oriented to person, place, and time.     Cranial Nerves: No cranial nerve deficit.  Psychiatric:        Mood and Affect: Mood normal.        Behavior: Behavior normal.    BP 106/74 (BP Location: Right Arm, Patient Position: Sitting, Cuff Size: Large)   Pulse 82   Temp 98 F (36.7 C) (Oral)   Ht 5\' 4"  (1.626 m)   Wt 158 lb (71.7 kg)   LMP 12/22/2018   SpO2 100%   BMI 27.12 kg/m         Assessment & Plan:  1. Dizziness Patient with complaint of recurrent episodes of dizziness and feels as if she may fall when dizziness occurs.  Will check BMP to make sure that there is no electrolyte abnormality such as low sodium or issues with potassium.  Exam was unremarkable.  Referral will be placed to neurology for further evaluation and treatment - Ambulatory referral to Neurology - Basic Metabolic Panel  2. Elevated liver enzymes Patient with elevated liver enzymes on blood work done in January 2019 with AST of 52 and ALT of 100.  We will repeat hepatic function at today's visit and patient will be notified if any further evaluation is needed based on these results.  Patient reports no use of alcohol or Tylenol.  No known history of hepatitis. - Hepatic Function Panel  3. GERD Stable with use of omeprazole.  Continue to avoid known trigger foods and avoid late night eating.  An After Visit Summary was printed and given to the patient.  Return if symptoms worsen or fail to improve, for follow-up with neurology.

## 2018-12-30 LAB — BASIC METABOLIC PANEL WITH GFR
BUN/Creatinine Ratio: 10 (ref 9–23)
BUN: 7 mg/dL (ref 6–20)
CO2: 22 mmol/L (ref 20–29)
Calcium: 10 mg/dL (ref 8.7–10.2)
Chloride: 99 mmol/L (ref 96–106)
Creatinine, Ser: 0.71 mg/dL (ref 0.57–1.00)
GFR calc Af Amer: 136 mL/min/1.73
GFR calc non Af Amer: 118 mL/min/1.73
Glucose: 74 mg/dL (ref 65–99)
Potassium: 4.2 mmol/L (ref 3.5–5.2)
Sodium: 140 mmol/L (ref 134–144)

## 2018-12-30 LAB — HEPATIC FUNCTION PANEL
ALT: 46 IU/L — ABNORMAL HIGH (ref 0–32)
AST: 25 IU/L (ref 0–40)
Albumin: 4.7 g/dL (ref 3.9–5.0)
Alkaline Phosphatase: 98 IU/L (ref 39–117)
Bilirubin Total: 0.7 mg/dL (ref 0.0–1.2)
Bilirubin, Direct: 0.17 mg/dL (ref 0.00–0.40)
Total Protein: 7.8 g/dL (ref 6.0–8.5)

## 2019-01-02 ENCOUNTER — Telehealth: Payer: Self-pay | Admitting: *Deleted

## 2019-01-02 NOTE — Telephone Encounter (Signed)
-----   Message from Antony Blackbird, MD sent at 01/01/2019  9:32 PM EST ----- Notify patient that her liver enzymes have improved and AST is now normal and ALT has decreased from 100 and is now at 46 (with normal of 0-32). Basic metabolic panel was normal

## 2019-01-02 NOTE — Telephone Encounter (Signed)
Medical Assistant used [employer] to contact patient.  Interpreter Name: Faye Ramsay #: [id number] Patient was not available, [employer] left patient a voicemail. Voicemail states to give a call back to [place] with [facility] at [phone].

## 2019-01-03 ENCOUNTER — Telehealth: Payer: Self-pay | Admitting: General Practice

## 2019-01-03 NOTE — Telephone Encounter (Signed)
Pt called to request her lab results, please follow up when possible

## 2019-01-04 NOTE — Telephone Encounter (Signed)
Medical Assistant used [employer] to contact patient.  Interpreter Name: Wilhemena Durie Interpreter #: [id number] Patient is aware of liver enzymes stabilizing and mineral levels being normal. No further questions.

## 2019-04-01 ENCOUNTER — Encounter: Payer: Self-pay | Admitting: Family Medicine

## 2019-04-01 DIAGNOSIS — K219 Gastro-esophageal reflux disease without esophagitis: Secondary | ICD-10-CM | POA: Insufficient documentation

## 2019-06-09 ENCOUNTER — Encounter: Payer: Self-pay | Admitting: Family Medicine

## 2019-06-09 ENCOUNTER — Other Ambulatory Visit: Payer: Self-pay

## 2019-06-09 ENCOUNTER — Ambulatory Visit: Payer: Self-pay | Attending: Family Medicine | Admitting: Family Medicine

## 2019-06-09 VITALS — BP 104/73 | HR 76 | Temp 98.6°F | Ht 64.0 in | Wt 165.4 lb

## 2019-06-09 DIAGNOSIS — H6691 Otitis media, unspecified, right ear: Secondary | ICD-10-CM

## 2019-06-09 MED ORDER — AMOXICILLIN-POT CLAVULANATE 500-125 MG PO TABS: 1.0000 | ORAL_TABLET | Freq: Two times a day (BID) | ORAL | 0 refills | Status: DC

## 2019-06-09 NOTE — Progress Notes (Signed)
Patient is here for right ear

## 2019-06-09 NOTE — Patient Instructions (Signed)
Otitis media en los adultos Otitis Media, Adult  Otitis media significa que el odo medio est rojo e hinchado (inflamado) y lleno de lquido. Generalmente, la afeccin desaparece sin tratamiento. Siga estas indicaciones en su casa:  Tome los medicamentos de venta libre y los recetados solamente como se lo haya indicado el mdico.  Si le recetaron un antibitico, tmelo como se lo haya indicado el mdico. No deje de tomar el antibitico aunque comience a sentirse mejor.  Concurra a todas las visitas de control como se lo haya indicado el mdico. Esto es importante. Comunquese con un mdico si:  Le sangra la nariz.  Tiene un bulto en el cuello.  No mejora luego de 5das.  Empeora en lugar de mejorar. Solicite ayuda de inmediato si:  Siente dolor que no se [employer].  Tiene hinchazn, enrojecimiento o dolor en el odo.  Tiene rigidez en el cuello.  No puede mover una parte de la cara (parlisis).  Nota que el hueso que se encuentra detrs de la oreja le duele al tocarlo.  Siente un dolor de cabeza muy intenso. Resumen  Otitis media significa que el odo medio est rojo, hinchado y lleno de lquido.  Generalmente, esta afeccin desaparece sin tratamiento. En algunos casos, puede no ser [employer].  Si le recetaron un antibitico, tmelo como se lo haya indicado el mdico. Esta informacin no tiene Marine scientist el consejo del mdico. Asegrese de hacerle al mdico cualquier pregunta que tenga. Document Released: 12/19/2010 Document Revised: 08/26/2017 Document Reviewed: 06/13/2013 Elsevier Patient Education  2020 [employer].

## 2019-06-10 ENCOUNTER — Encounter: Payer: Self-pay | Admitting: Family Medicine

## 2019-06-10 NOTE — Progress Notes (Signed)
Established Patient Office Visit  Subjective:  Patient ID: Alexa Alvarez, female    DOB: [birth year]  Age: 28 y.o. MRN: [id number]   Due to a language barrier, [employer] video interpretation services were used at today's visit  CC:  Chief Complaint  Patient presents with  . Ear Problem    HPI Alexa Alvarez presents for complaint of onset approximately 3 weeks ago of right ear pain which occurs off and on.  Ear pain is sometimes sharp and sometimes throbbing.  Ear pain is improved with the use of over-the-counter pain medication but then returns.  She denies any fever or chills, no nasal congestion, no sore throat and no sensation of postnasal drainage.  She has had no hearing loss or change in hearing associated with her ear pain.  She does not have any increased ear pain with touching the outside of her ear.  Past Medical History:  Diagnosis Date  . GERD (gastroesophageal reflux disease)     Past Surgical History:  Procedure Laterality Date  . CESAREAN SECTION      Family History  Problem Relation Age of Onset  . Diabetes Mother   . Hypertension Mother     Social History   Tobacco Use  . Smoking status: Never Smoker  . Smokeless tobacco: Never Used  Substance Use Topics  . Alcohol use: No  . Drug use: No    Outpatient Medications Prior to Visit  Medication Sig Dispense Refill  . omeprazole (PRILOSEC) 20 MG capsule Take 1 capsule (20 mg total) by mouth daily. To reduce stomach acid 30 capsule 3  . ondansetron (ZOFRAN ODT) 4 MG disintegrating tablet 4mg  ODT q4 hours prn nausea/vomit (Patient not taking: Reported on 06/28/2018) 8 tablet 0   No facility-administered medications prior to visit.     No Known Allergies  ROS Review of Systems  Constitutional: Negative for chills and fever.  HENT: Positive for ear pain. Negative for congestion, ear discharge, hearing loss, postnasal drip, rhinorrhea and sore throat.   Eyes: Negative for photophobia  and visual disturbance.  Respiratory: Negative for cough and shortness of breath.   Cardiovascular: Negative for chest pain and palpitations.  Gastrointestinal: Negative for abdominal pain, constipation, diarrhea and nausea.  Endocrine: Negative for cold intolerance, heat intolerance, polydipsia, polyphagia and polyuria.  Genitourinary: Negative for dysuria and frequency.  Musculoskeletal: Negative for arthralgias, myalgias and neck pain.  Neurological: Negative for dizziness and headaches.  Hematological: Negative for adenopathy. Does not bruise/bleed easily.      Objective:    Physical Exam  Constitutional: She is oriented to person, place, and time. She appears well-developed and well-nourished.  HENT:  Right Ear: Hearing, external ear and ear canal normal. Tympanic membrane is erythematous.  Left Ear: Hearing, external ear and ear canal normal.  Nose: Mucosal edema and rhinorrhea (Mild, clear to light white nasal drainage within nasal canal; edematous and slightly erythematous nasal turbinates/mucosa) present. Right sinus exhibits no maxillary sinus tenderness and no frontal sinus tenderness. Left sinus exhibits no maxillary sinus tenderness and no frontal sinus tenderness.  Mouth/Throat: Posterior oropharyngeal edema (Mild) and posterior oropharyngeal erythema (Mild) present. No oropharyngeal exudate.  Right TM is erythematous and slightly thickened, patient's left TM is slightly pink but without thickening and landmarks are visible; prominent tonsils/adenoids and mild posterior pharynx edema/erythema  Eyes: Conjunctivae and EOM are normal.  Neck: Normal range of motion. Neck supple.  Pulmonary/Chest: Effort normal and breath sounds normal.  Abdominal: Soft. There is no abdominal  tenderness. There is no rebound and no guarding.  Musculoskeletal:        General: No tenderness or edema.     Comments: No CVA tenderness  Lymphadenopathy:    She has no cervical adenopathy.    Neurological: She is alert and oriented to person, place, and time.  Skin: Skin is warm and dry.  Psychiatric: She has a normal mood and affect. Her behavior is normal.  Nursing note and vitals reviewed.   BP 104/73 (BP Location: Left Arm, Patient Position: Sitting, Cuff Size: Large)   Pulse 76   Temp 98.6 F (37 C) (Oral)   Ht 5\' 4"  (1.626 m)   Wt 165 lb 6.4 oz (75 kg)   LMP 06/08/2019   SpO2 98%   BMI 28.39 kg/m  Wt Readings from Last 3 Encounters:  06/09/19 165 lb 6.4 oz (75 kg)  12/29/18 158 lb (71.7 kg)  11/17/18 162 lb (73.5 kg)     Health Maintenance Due  Topic Date Due  . HIV Screening  06/06/2007  . TETANUS/TDAP  06/06/2011  . PAP-Cervical Cytology Screening  03/21/2019  . PAP SMEAR-Modifier  03/21/2019    There are no preventive care reminders to display for this patient.  No results found for: TSH Lab Results  Component Value Date   WBC 6.3 11/17/2018   HGB 12.9 11/17/2018   HCT 39.2 11/17/2018   MCV 89 11/17/2018   PLT 378 11/17/2018   Lab Results  Component Value Date   NA 140 12/29/2018   K 4.2 12/29/2018   CO2 22 12/29/2018   GLUCOSE 74 12/29/2018   BUN 7 12/29/2018   CREATININE 0.71 12/29/2018   BILITOT 0.7 12/29/2018   ALKPHOS 98 12/29/2018   AST 25 12/29/2018   ALT 46 (H) 12/29/2018   PROT 7.8 12/29/2018   ALBUMIN 4.7 12/29/2018   CALCIUM 10.0 12/29/2018   ANIONGAP 10 12/16/2017   No results found for: CHOL No results found for: HDL No results found for: LDLCALC No results found for: TRIG No results found for: [facility] Lab Results  Component Value Date   HGBA1C 5.3 11/17/2018      Assessment & Plan:  1. Right otitis media, unspecified otitis media type Patient with right otitis media on exam and additionally likely has URI type symptoms.  Prescription provided for Augmentin 500 mg twice daily x7 days.  Patient should eat before taking the medication to help avoid stomach upset.  Continue use of over-the-counter pain medication  as needed.  Call or return if your pain is not improving or if she has any worsening of your pain.  Information on otitis media provided as part of after visit summary - amoxicillin-clavulanate (AUGMENTIN) 500-125 MG tablet; Take 1 tablet (500 mg total) by mouth 2 (two) times daily. Take after eating  Dispense: 14 tablet; Refill: 0   Meds ordered this encounter  Medications  . amoxicillin-clavulanate (AUGMENTIN) 500-125 MG tablet    Sig: Take 1 tablet (500 mg total) by mouth 2 (two) times daily. Take after eating    Dispense:  14 tablet    Refill:  0    Follow-up: Return if symptoms worsen or fail to improve, for ear pain-1 or 2 weeks if not better.    Antony Blackbird, MD

## 2020-01-27 ENCOUNTER — Ambulatory Visit: Payer: Self-pay

## 2020-03-09 ENCOUNTER — Encounter (HOSPITAL_COMMUNITY): Payer: Self-pay | Admitting: Emergency Medicine

## 2020-03-09 ENCOUNTER — Inpatient Hospital Stay (HOSPITAL_COMMUNITY): Payer: Self-pay

## 2020-03-09 ENCOUNTER — Inpatient Hospital Stay (HOSPITAL_COMMUNITY)
Admission: EM | Admit: 2020-03-09 | Discharge: 2020-03-09 | Disposition: A | Discharge status: Home / Self Care | Payer: Self-pay | Attending: Obstetrics and Gynecology | Admitting: Obstetrics and Gynecology

## 2020-03-09 ENCOUNTER — Other Ambulatory Visit: Payer: Self-pay

## 2020-03-09 DIAGNOSIS — R109 Unspecified abdominal pain: Secondary | ICD-10-CM

## 2020-03-09 DIAGNOSIS — O26891 Other specified pregnancy related conditions, first trimester: Secondary | ICD-10-CM

## 2020-03-09 DIAGNOSIS — O209 Hemorrhage in early pregnancy, unspecified: Secondary | ICD-10-CM | POA: Insufficient documentation

## 2020-03-09 DIAGNOSIS — O3680X0 Pregnancy with inconclusive fetal viability, not applicable or unspecified: Secondary | ICD-10-CM

## 2020-03-09 DIAGNOSIS — Z3A01 Less than 8 weeks gestation of pregnancy: Secondary | ICD-10-CM | POA: Insufficient documentation

## 2020-03-09 LAB — WET PREP, GENITAL
Sperm: NONE SEEN
Trich, Wet Prep: NONE SEEN
Yeast Wet Prep HPF POC: NONE SEEN

## 2020-03-09 LAB — URINALYSIS, ROUTINE W REFLEX MICROSCOPIC

## 2020-03-09 LAB — CBC
HCT: 40 % (ref 36.0–46.0)
Hemoglobin: 13 g/dL (ref 12.0–15.0)
MCH: 30.4 pg (ref 26.0–34.0)
MCHC: 32.5 g/dL (ref 30.0–36.0)
MCV: 93.5 fL (ref 80.0–100.0)
Platelets: 278 10*3/uL (ref 150–400)
RBC: 4.28 MIL/uL (ref 3.87–5.11)
RDW: 12.4 % (ref 11.5–15.5)
WBC: 12.5 10*3/uL — ABNORMAL HIGH (ref 4.0–10.5)
nRBC: 0 % (ref 0.0–0.2)

## 2020-03-09 LAB — URINALYSIS, MICROSCOPIC (REFLEX)
Bacteria, UA: NONE SEEN
RBC / HPF: >50 RBC/hpf (ref 0–5)

## 2020-03-09 LAB — HCG, QUANTITATIVE, PREGNANCY: hCG, Beta Chain, Quant, S: 187 m[IU]/mL — ABNORMAL HIGH (ref <5)

## 2020-03-09 LAB — ABO/RH: ABO/RH(D): AB POS

## 2020-03-09 LAB — POC URINE PREG, ED: Preg Test, Ur: POSITIVE — AB

## 2020-03-09 NOTE — Discharge Instructions (Signed)
Dolor abdominal durante el embarazo Abdominal Pain During Pregnancy  El dolor de vientre (abdominal) es habitual durante el embarazo. Hay muchas causas posibles. Generalmente, no se trata de un problema grave. Otras veces puede ser un signo de que algo no anda bien. Siempre comunquese con su mdico si tiene dolor abdominal. Siga estas indicaciones en su casa:  No tenga relaciones sexuales ni se coloque nada dentro de la vagina hasta que el dolor haya desaparecido completamente.  Descanse todo lo que pueda [employer] dolor se le haya calmado.  Beba suficiente lquido como para mantener el pis (orina) de color amarillo plido.  Tome los medicamentos de venta libre y los recetados solamente como se lo haya indicado el mdico.  Consulting civil engineer a todas las visitas de control como se lo haya indicado el mdico. Esto es importante. Comunquese con un mdico si:  El dolor contina o empeora despus de Production assistant, radio.  Siente dolor en la parte inferior del abdomen que: ? Va y viene en intervalos regulares. ? Se extiende a la espalda. ? Se siente como clicos menstruales.  Siente dolor o ardor al Garment/textile technologist. Solicite ayuda de inmediato si:  Tiene fiebre o siente escalofros.  Tiene una hemorragia vaginal abundante.  Tiene una prdida de lquido por la vagina.  Elimina tejidos por la vagina.  Vomita durante ms de 24horas.  Tiene heces lquidas (diarrea) durante ms de 24horas.  El beb se mueve menos de lo habitual.  Se siente dbil o se desmaya.  Le falta el aire.  Tiene dolor muy intenso en la parte superior del abdomen. Resumen  El dolor de vientre (abdominal) es habitual durante el embarazo. Hay muchas causas posibles.  Si tiene Media planner abdomen durante el embarazo, avise al mdico de inmediato.  Concurra a todas las visitas de control como se lo haya indicado el mdico. Esto es importante. Esta informacin no tiene Marine scientist el consejo del mdico. Asegrese de  hacerle al mdico cualquier pregunta que tenga. Document Revised: 05/11/2017 Document Reviewed: 05/11/2017 Elsevier Patient Education  Stout para tomar durante el embarazo  Safe Medications in Pregnancy  Acn:  Benzoyl Peroxide (Perxido de benzolo)  Salicylic Acid (cido saliclico)  Dolor de espalda/Dolor de cabeza:  Tylenol: 2 pastillas de concentracin regular cada 4 horas O 2 pastillas de concentracin fuerte cada 6 horas  Resfriados/Tos/Alergias:  Benadryl (sin alcohol) 25 mg cada 6 horas segn lo necesite Breath Right strips (Tiras para respirar correctamente)  Claritin  Cepacol (pastillas de chupar para la garganta)  Chloraseptic (aerosol para la garganta)  Cold-Eeze- hasta tres veces por da  Cough drops (pastillas de chupar para la tos, sin alcohol)  Flonase (con receta mdica solamente)  Guaifenesin  Mucinex  Robitussin DM (simple solamente, sin alcohol)  Saline nasal spray/drops (Aerosol nasal salino/gotas) Sudafed (pseudoephedrine) y  Actifed * utilizar slo despus de 12 semanas de gestacin y si no tiene la presin arterial alta.  Tylenol Vicks  VapoRub  Zinc lozenges (pastillas para la garganta)  Zyrtec  Estreimiento:  Colace  Ducolax (supositorios)  Fleet enema (lavado intestinal rectal)  Glycerin (supositorios)  Metamucil  Milk of magnesia (leche de magnesia)  Miralax  Senokot  Smooth Move (t)  Diarrea:  Kaopectate Imodium A-D  *NO tome Pepto-Bismol  Hemorroides:  Anusol  Anusol HC  Preparation H  Tucks  Indigestin:  Tums  Maalox  Mylanta  Zantac  Pepcid  Insomnia:  Benadryl (sin alcohol) 25mg  cada 6 horas segn  lo necesite  Tylenol PM  Unisom, no Gelcaps  Calambres en las piernas:  Tums  MagGel Nuseas/Vmitos:  Bonine  Dramamine  Emetrol  Ginger (extracto)  Sea-Bands  Meclizine  Medicina para las nuseas que puede tomar durante el embarazo: Unisom (doxylamine succinate, pastillas de 25  mg) Tome una pastilla al da al [place]. Si los sntomas no estn adecuadamente controlados, la dosis puede aumentarse hasta una dosis mxima recomendada de [employer] al da (1/2 pastilla por la [place], 1/2 pastilla a media tarde y Ardelia Mems pastilla al [place]). Pastillas de Vitamina B6 de 100mg . Tome [employer] veces al da (hasta 200 mg por da).  Erupciones en la piel:  Productos de Aveeno  Benadryl cream (crema o una dosis de 25mg  cada 6 horas segn lo necesite)  Calamine Lotion (locin)  1% cortisone cream (crema de cortisona de 1%)  nfeccin vaginal por hongos (candidiasis):  Gyne-lotrimin 7  Monistat 7   **Si est tomando varias medicinas, por favor revise las etiquetas para Conservation officer, nature los mismos ingredientes [place]. **Tome la medicina segn lo indicado en la etiqueta. **No tome ms de 400 mg de Tylenol en 24 horas. **No tome medicinas que contengan aspirina o ibuprofeno.

## 2020-03-09 NOTE — ED Notes (Signed)
Pt unable to give UA sample at this time. 

## 2020-03-09 NOTE — MAU Provider Note (Signed)
History     CSN: [id number]  Arrival date and time: 03/09/20 [id number]   First Provider Initiated Contact with Patient 03/09/20 0901      Chief Complaint  Patient presents with  . Abdominal Pain  . Vaginal Bleeding   HPI Alexa Alvarez is a 29 y.o. G2P1001 at [redacted]w[redacted]d who presents with abdominal pain and bright red bleeding at this morning. She states her LMP was in February but she has not taken a pregnancy test. She rates the abdominal pain a 5/10 and has not tried anything for the pain. She states the bleeding is heavy like a period.   OB History    Gravida  2   Para  1   Term  1   Preterm      AB      Living  1     SAB      TAB      Ectopic      Multiple      Live Births  1           Past Medical History:  Diagnosis Date  . GERD (gastroesophageal reflux disease)     Past Surgical History:  Procedure Laterality Date  . CESAREAN SECTION      Family History  Problem Relation Age of Onset  . Diabetes Mother   . Hypertension Mother     Social History   Tobacco Use  . Smoking status: Never Smoker  . Smokeless tobacco: Never Used  Substance Use Topics  . Alcohol use: No  . Drug use: No    Allergies: No Known Allergies  Medications Prior to Admission  Medication Sig Dispense Refill Last Dose  . amoxicillin-clavulanate (AUGMENTIN) 500-125 MG tablet Take 1 tablet (500 mg total) by mouth 2 (two) times daily. Take after eating 14 tablet 0   . omeprazole (PRILOSEC) 20 MG capsule Take 1 capsule (20 mg total) by mouth daily. To reduce stomach acid 30 capsule 3   . ondansetron (ZOFRAN ODT) 4 MG disintegrating tablet 4mg  ODT q4 hours prn nausea/vomit (Patient not taking: Reported on 06/28/2018) 8 tablet 0     Review of Systems  Constitutional: Negative.  Negative for fatigue and fever.  HENT: Negative.   Respiratory: Negative.  Negative for shortness of breath.   Cardiovascular: Negative.  Negative for chest pain.  Gastrointestinal: Positive  for abdominal pain. Negative for constipation, diarrhea, nausea and vomiting.  Genitourinary: Positive for vaginal bleeding. Negative for dysuria.  Neurological: Negative.  Negative for dizziness and headaches.   Physical Exam   Blood pressure 138/89, pulse 85, temperature 98.2 F (36.8 C), temperature source Oral, resp. rate 17, height 5\' 5"  (1.651 m), weight 80 kg, last menstrual period 01/20/2020, SpO2 98 %.  Physical Exam  Nursing note and vitals reviewed. Constitutional: She is oriented to person, place, and time. She appears well-developed and well-nourished. No distress.  HENT:  Head: Normocephalic.  Eyes: Pupils are equal, round, and reactive to light.  Cardiovascular: Normal rate, regular rhythm and normal heart sounds.  Respiratory: Effort normal and breath sounds normal. No respiratory distress.  GI: Soft. Bowel sounds are normal. She exhibits no distension. There is no abdominal tenderness.  Genitourinary:    Genitourinary Comments: SSE: moderate amount of bright red bleeding in vault and coming from cervical os.   Bimanual exam: Cervix 0/long/high, firm, anterior, neg CMT, uterus nontender, adnexa without tenderness, enlargement, or mass    Neurological: She is alert and oriented to person, place, and  time.  Skin: Skin is warm and dry.  Psychiatric: She has a normal mood and affect. Her behavior is normal. Judgment and thought content normal.    MAU Course  Procedures Results for orders placed or performed during the hospital encounter of 03/09/20 (from the past 24 hour(s))  POC Urine Pregnancy, ED (not at [facility])     Status: Abnormal   Collection Time: 03/09/20  8:19 AM  Result Value Ref Range   Preg Test, Ur POSITIVE (A) NEGATIVE  Wet prep, genital     Status: Abnormal   Collection Time: 03/09/20  9:03 AM  Result Value Ref Range   Yeast Wet Prep HPF POC NONE SEEN NONE SEEN   Trich, Wet Prep NONE SEEN NONE SEEN   Clue Cells Wet Prep HPF POC PRESENT (A) NONE SEEN    WBC, Wet Prep HPF POC FEW (A) NONE SEEN   Sperm NONE SEEN   CBC     Status: Abnormal   Collection Time: 03/09/20  9:33 AM  Result Value Ref Range   WBC 12.5 (H) 4.0 - 10.5 K/uL   RBC 4.28 3.87 - 5.11 MIL/uL   Hemoglobin 13.0 12.0 - 15.0 g/dL   HCT 40.0 36.0 - 46.0 %   MCV 93.5 80.0 - 100.0 fL   MCH 30.4 26.0 - 34.0 pg   MCHC 32.5 30.0 - 36.0 g/dL   RDW 12.4 11.5 - 15.5 %   Platelets 278 150 - 400 K/uL   nRBC 0.0 0.0 - 0.2 %  hCG, quantitative, pregnancy     Status: Abnormal   Collection Time: 03/09/20  9:33 AM  Result Value Ref Range   hCG, Beta Chain, Quant, S 187 (H) <5 mIU/mL  ABO/Rh     Status: None   Collection Time: 03/09/20  9:33 AM  Result Value Ref Range   ABO/RH(D) AB POS    No rh immune globuloin      NOT A RH IMMUNE GLOBULIN CANDIDATE, PT RH POSITIVE Performed at [facility], [address], [place], [place] [address]   Urinalysis, Routine w reflex microscopic     Status: Abnormal   Collection Time: 03/09/20 10:46 AM  Result Value Ref Range   Color, Urine RED (A) YELLOW   APPearance TURBID (A) CLEAR   Specific Gravity, Urine  1.005 - 1.030    TEST NOT REPORTED DUE TO COLOR INTERFERENCE OF URINE PIGMENT   pH  5.0 - 8.0    TEST NOT REPORTED DUE TO COLOR INTERFERENCE OF URINE PIGMENT   Glucose, UA (A) NEGATIVE mg/dL    TEST NOT REPORTED DUE TO COLOR INTERFERENCE OF URINE PIGMENT   Hgb urine dipstick (A) NEGATIVE    TEST NOT REPORTED DUE TO COLOR INTERFERENCE OF URINE PIGMENT   Bilirubin Urine (A) NEGATIVE    TEST NOT REPORTED DUE TO COLOR INTERFERENCE OF URINE PIGMENT   Ketones, ur (A) NEGATIVE mg/dL    TEST NOT REPORTED DUE TO COLOR INTERFERENCE OF URINE PIGMENT   Protein, ur (A) NEGATIVE mg/dL    TEST NOT REPORTED DUE TO COLOR INTERFERENCE OF URINE PIGMENT   Nitrite (A) NEGATIVE    TEST NOT REPORTED DUE TO COLOR INTERFERENCE OF URINE PIGMENT   Leukocytes,Ua (A) NEGATIVE    TEST NOT REPORTED DUE TO COLOR INTERFERENCE OF URINE PIGMENT  Urinalysis,  Microscopic (reflex)     Status: None   Collection Time: 03/09/20 10:46 AM  Result Value Ref Range   RBC / HPF >50 0 -  5 RBC/hpf   WBC, UA 0-5 0 - 5 WBC/hpf   Bacteria, UA NONE SEEN NONE SEEN   Squamous Epithelial / LPF 0-5 0 - 5   Mucus PRESENT    US OB LESS THAN 14 WEEKS WITH OB TRANSVAGINAL  Result Date: 03/09/2020 CLINICAL DATA:  Vaginal bleeding with positive pregnancy test. Last menstrual period 01/28/2020, 7 weeks and 0 days by LMP. EXAM: OBSTETRIC <14 WK [place] AND TRANSVAGINAL OB US TECHNIQUE: Both transabdominal and transvaginal ultrasound examinations were performed for complete evaluation of the gestation as well as the maternal uterus, adnexal regions, and pelvic cul-de-sac. Transvaginal technique was performed to assess early pregnancy. COMPARISON:  None FINDINGS: Intrauterine gestational sac: None Yolk sac:  Not visualized Embryo:  Not visualized Maternal uterus/adnexae: Thickened endometrium. No adnexal mass. Small free fluid in the cul-de-sac. IMPRESSION: Pregnancy of unknown anatomic location (no intrauterine gestational sac or adnexal mass identified). Differential diagnosis includes recent spontaneous miscarriage, IUP too early to visualize, and non-visualized ectopic pregnancy. Recommend correlation with serial beta-hCG levels, and follow up US in 7-14 days or as clinically warranted. Electronically Signed   By: Zetta Bills M.D.   On: 03/09/2020 11:14   MDM UA, UPT CBC, HCG ABO/Rh- AB Pos Wet prep and gc/chlamydia US OB Comp Less 14 weeks with Transvaginal   Assessment and Plan   1. Pregnancy of unknown anatomic location   2. Vaginal bleeding affecting early pregnancy   3. [redacted] weeks gestation of pregnancy    -Discharge home in stable condition -Vaginal bleeding and pain precautions discussed. Strict ectopic warning signs reviewed at length -Patient advised to follow-up with [facility] on Monday at 1400 for repeat HCG -Patient may return to MAU as needed or if her condition  were to change or worsen   Wende Mott CNM 03/09/2020, 11:42 AM

## 2020-03-09 NOTE — ED Triage Notes (Signed)
Patient reports low abdominal pain with vaginal bleeding this morning , LMP Feb. 20,2021 G2P1 , denies emesis or diarrhea .

## 2020-03-09 NOTE — MAU Note (Addendum)
Alexa Alvarez is a 29 y.o. at [redacted]w[redacted]d here in MAU reporting:  +lower abdominal and back pain Intermittent cramping Pain score: 5/10 Has not tried anything for the pain. Onset of complaint: "over the course of 2-3 weeks it has been coming and going"   LMP: 01/20/20 +pregnancy test in the ED  +vaginal bleeding Onset of complaint: 630 am this morning Reports the bleeding is less then a period initially red and not "orangish" Having to wear a pad Denies clots   Vitals:   03/09/20 0701 03/09/20 0853  BP: 127/87 138/89  Pulse: 68 85  Resp: 17 17  Temp: 98.6 F (37 C) 98.2 F (36.8 C)  SpO2: 98% 98%     Lab orders placed from triage: urine left in [facility] ED. RN will call to verify if there is enough sample for a UA.

## 2020-03-09 NOTE — ED Notes (Signed)
Pt. Unable to urinate  

## 2020-03-09 NOTE — MAU Note (Signed)
Spoke with Santiago Glad in the ED whom transferred RN to lab. They reported that they had no saved urine sample for patient. Will need to be recollected for testing.

## 2020-03-11 ENCOUNTER — Other Ambulatory Visit: Payer: Self-pay

## 2020-03-11 ENCOUNTER — Ambulatory Visit (INDEPENDENT_AMBULATORY_CARE_PROVIDER_SITE_OTHER): Payer: Self-pay

## 2020-03-11 DIAGNOSIS — O3680X0 Pregnancy with inconclusive fetal viability, not applicable or unspecified: Secondary | ICD-10-CM

## 2020-03-11 LAB — GC/CHLAMYDIA PROBE AMP (~~LOC~~) NOT AT ARMC
Chlamydia: NEGATIVE
Comment: NEGATIVE
Comment: NORMAL
Neisseria Gonorrhea: NEGATIVE

## 2020-03-11 LAB — BETA HCG QUANT (REF LAB): hCG Quant: 18 m[IU]/mL

## 2020-03-11 NOTE — Progress Notes (Signed)
Pt here today for STAT Beta Lab.  With Video Interpreter # [phone] pt states that she is getting her hormone levels drawn to see if she has miscarried.  Pt reports that she is having mild pain that Tylenol is effective for.  Pt states that she has been having vaginal bleeding since Saturday of and on, changing her pads 2-3 times a day.  I explained to the pt that it will take approximately two hours for results and that I will call her with them and f/u.  Pt verbalized understanding.   Received results of beta from [facility] of 18.  Notified Loma Boston, DO who states that pt has miscarried and to have pt scheduled lab only appt to follow levels to zero.  With [place] # [phone] pt informed of providers recommendation and asked if she could come on 03/18/20 @ 1100 for a non stat beta level to follow her levels to zero.  Pt agreed to the appt.  Pt asks if she should continue to work.  I advised pt that it is up to her however we are not medically taking her out of work.  I informed pt that it would be her decision to make and that we are not taking her out of work.  Pt verbalized understanding with no further questions.   Mel Almond, RN 03/11/20

## 2020-03-11 NOTE — Progress Notes (Signed)
Chart reviewed - agree with CMA/RN documentation.  ° °

## 2020-03-18 ENCOUNTER — Other Ambulatory Visit: Payer: Self-pay

## 2020-03-18 ENCOUNTER — Other Ambulatory Visit: Payer: Self-pay | Admitting: General Practice

## 2020-03-18 DIAGNOSIS — O039 Complete or unspecified spontaneous abortion without complication: Secondary | ICD-10-CM

## 2020-03-19 ENCOUNTER — Telehealth (INDEPENDENT_AMBULATORY_CARE_PROVIDER_SITE_OTHER): Payer: Self-pay

## 2020-03-19 DIAGNOSIS — O034 Incomplete spontaneous abortion without complication: Secondary | ICD-10-CM

## 2020-03-19 LAB — BETA HCG QUANT (REF LAB): hCG Quant: <1 m[IU]/mL

## 2020-03-19 NOTE — Telephone Encounter (Addendum)
-----   Message from Truett Mainland, DO sent at 03/19/2020 10:43 AM EDT ----- HCG normal. No further labs needed. Follow up for contraceptive care.   Called pt with [facility] interpreter ID [phone]. Result given. SAB follow-up appt scheduled for 04/05/20 at 1055. Pt states she would like to be pregnant again; advised pt to use condoms until she has had one normal menstrual cycle. Encouraged pt to follow up with the office as needed.

## 2020-04-05 ENCOUNTER — Ambulatory Visit: Payer: Self-pay | Admitting: Nurse Practitioner

## 2020-04-11 ENCOUNTER — Other Ambulatory Visit: Payer: Self-pay

## 2020-04-11 ENCOUNTER — Encounter: Payer: Self-pay | Admitting: Obstetrics and Gynecology

## 2020-04-11 ENCOUNTER — Ambulatory Visit (INDEPENDENT_AMBULATORY_CARE_PROVIDER_SITE_OTHER): Payer: Self-pay | Admitting: Obstetrics and Gynecology

## 2020-04-11 VITALS — BP 122/86 | HR 85 | Ht 62.0 in | Wt 178.0 lb

## 2020-04-11 DIAGNOSIS — O039 Complete or unspecified spontaneous abortion without complication: Secondary | ICD-10-CM

## 2020-04-11 DIAGNOSIS — Z3202 Encounter for pregnancy test, result negative: Secondary | ICD-10-CM

## 2020-04-11 LAB — POCT PREGNANCY, URINE: Preg Test, Ur: NEGATIVE

## 2020-04-11 NOTE — Progress Notes (Signed)
History   Chief Complaint:  Follow-up (SAB)  Alexa Alvarez is  29 y.o. G2P1001 Patient's last menstrual period was 01/20/2020.Marland Kitchen Patient is here for follow up of quantitative HCG and ongoing surveillance of pregnancy status.   She has a confirmed an SAB. Last quant was 0. Unprotected sex since last visit.    Since her last visit, the patient is without new complaint.   The patient reports bleeding as  none now.    General ROS:  negative  Her previous Quantitative HCG values are: <1 on 4/19  9 y/o child at home.    Physical Exam  Blood pressure 122/86, pulse 85, height 5\' 2"  (1.575 m), weight 178 lb (80.7 kg), last menstrual period 01/20/2020, unknown if currently breastfeeding.   Labs:  Urine pregnancy test negative today.  Results for orders placed or performed in visit on 04/11/20 (from the past 24 hour(s))  Pregnancy, urine POC   Collection Time: 04/11/20  3:39 PM  Result Value Ref Range   Preg Test, Ur NEGATIVE NEGATIVE    Ultrasound Studies:   No results found.  Assessment:  Complete SAB Quant is 0  Plan: The patient is instructed to follow up as needed.   Noni Saupe I, NP. 04/11/2020 4:17 PM

## 2020-05-02 ENCOUNTER — Ambulatory Visit: Payer: Self-pay | Admitting: Family Medicine

## 2020-05-29 NOTE — Progress Notes (Deleted)
Patient ID: Alexa Alvarez, female    DOB: 06-18-1991  MRN: [id number]  CC: No chief complaint on file.   Subjective: Alexa Alvarez is a 29 y.o. female who presents for Her concerns today include: ***  Patient Active Problem List   Diagnosis Date Noted   GERD (gastroesophageal reflux disease) 04/01/2019     Current Outpatient Medications on File Prior to Visit  Medication Sig Dispense Refill   prenatal vitamin w/FE, FA (PRENATAL 1 + 1) 27-1 MG TABS tablet Take 1 tablet by mouth daily at 12 noon.     No current facility-administered medications on file prior to visit.    No Known Allergies  Social History   Socioeconomic History   Marital status: Single    Spouse name: Not on file   Number of children: Not on file   Years of education: Not on file   Highest education level: Not on file  Occupational History   Not on file  Tobacco Use   Smoking status: Never Smoker   Smokeless tobacco: Never Used  Vaping Use   Vaping Use: Never used  Substance and Sexual Activity   Alcohol use: No   Drug use: No   Sexual activity: Yes    Birth control/protection: None, Pill  Other Topics Concern   Not on file  Social History Narrative   Not on file   Social Determinants of Health   Financial Resource Strain:    Difficulty of Paying Living Expenses:   Food Insecurity: No Food Insecurity   Worried About Running Out of Food in the Last Year: Never true   Ran Out of Food in the Last Year: Never true  Transportation Needs: No Transportation Needs   Lack of Transportation (Medical): No   Lack of Transportation (Non-Medical): No  Physical Activity:    Days of Exercise per Week:    Minutes of Exercise per Session:   Stress:    Feeling of Stress :   Social Connections:    Frequency of Communication with Friends and Family:    Frequency of Social Gatherings with Friends and Family:    Attends Religious Services:    Active Member  of Clubs or Organizations:    Attends Music therapist:    Marital Status:   Intimate Partner Violence:    Fear of Current or Ex-Partner:    Emotionally Abused:    Physically Abused:    Sexually Abused:     Family History  Problem Relation Age of Onset   Diabetes Mother    Hypertension Mother     Past Surgical History:  Procedure Laterality Date   CESAREAN SECTION      ROS: Review of Systems Negative except as stated above  PHYSICAL EXAM: LMP 01/20/2020   Physical Exam  {female adult master:310786} {female adult master:310785}  CMP Latest Ref Rng & Units 12/29/2018 12/16/2017  Glucose 65 - 99 mg/dL 74 88  BUN 6 - 20 mg/dL 7 5(L)  Creatinine 0.57 - 1.00 mg/dL 0.71 0.69  Sodium 134 - 144 mmol/L 140 139  Potassium 3.5 - 5.2 mmol/L 4.2 3.7  Chloride 96 - 106 mmol/L 99 105  CO2 20 - 29 mmol/L 22 24  Calcium 8.7 - 10.2 mg/dL 10.0 9.7  Total Protein 6.0 - 8.5 g/dL 7.8 7.9  Total Bilirubin 0.0 - 1.2 mg/dL 0.7 1.2  Alkaline Phos 39 - 117 IU/L 98 73  AST 0 - 40 IU/L 25 52(H)  ALT 0 -  32 IU/L 46(H) 100(H)   Lipid Panel  No results found for: CHOL, TRIG, HDL, CHOLHDL, VLDL, LDLCALC, LDLDIRECT  CBC    Component Value Date/Time   WBC 12.5 (H) 03/09/2020 0933   RBC 4.28 03/09/2020 0933   HGB 13.0 03/09/2020 0933   HGB 12.9 11/17/2018 1721   HCT 40.0 03/09/2020 0933   HCT 39.2 11/17/2018 1721   PLT 278 03/09/2020 0933   PLT 378 11/17/2018 1721   MCV 93.5 03/09/2020 0933   MCV 89 11/17/2018 1721   MCH 30.4 03/09/2020 0933   MCHC 32.5 03/09/2020 0933   RDW 12.4 03/09/2020 0933   RDW 11.8 (L) 11/17/2018 1721   LYMPHSABS 2.6 11/17/2018 1721   EOSABS 0.1 11/17/2018 1721   BASOSABS 0.0 11/17/2018 1721    ASSESSMENT AND PLAN:  There are no diagnoses linked to this encounter.   Patient was given the opportunity to ask questions.  Patient verbalized understanding of the plan and was able to repeat key elements of the plan.   No orders of the  defined types were placed in this encounter.    Requested Prescriptions    No prescriptions requested or ordered in this encounter    No follow-ups on file.  Alexa Herter, NP

## 2020-05-31 ENCOUNTER — Other Ambulatory Visit: Payer: Self-pay

## 2020-05-31 ENCOUNTER — Ambulatory Visit: Payer: Self-pay | Admitting: Family

## 2020-11-04 LAB — OB RESULTS CONSOLE GC/CHLAMYDIA
Chlamydia: NEGATIVE
Gonorrhea: NEGATIVE

## 2020-11-04 LAB — OB RESULTS CONSOLE ABO/RH: RH Type: POSITIVE

## 2020-11-04 LAB — OB RESULTS CONSOLE ANTIBODY SCREEN: Antibody Screen: NEGATIVE

## 2020-11-04 LAB — OB RESULTS CONSOLE RUBELLA ANTIBODY, IGM: Rubella: IMMUNE

## 2020-11-04 LAB — OB RESULTS CONSOLE HIV ANTIBODY (ROUTINE TESTING): HIV: NONREACTIVE

## 2020-11-04 LAB — OB RESULTS CONSOLE HEPATITIS B SURFACE ANTIGEN: Hepatitis B Surface Ag: NEGATIVE

## 2020-11-04 LAB — OB RESULTS CONSOLE RPR: RPR: NONREACTIVE

## 2021-02-11 ENCOUNTER — Inpatient Hospital Stay (HOSPITAL_COMMUNITY): Payer: Self-pay

## 2021-02-11 ENCOUNTER — Encounter: Payer: Self-pay | Admitting: Student

## 2021-02-11 ENCOUNTER — Inpatient Hospital Stay (HOSPITAL_COMMUNITY)
Admission: AD | Admit: 2021-02-11 | Discharge: 2021-02-12 | Disposition: A | Discharge status: Home / Self Care | Payer: Self-pay | Attending: Obstetrics and Gynecology | Admitting: Obstetrics and Gynecology

## 2021-02-11 DIAGNOSIS — R109 Unspecified abdominal pain: Secondary | ICD-10-CM | POA: Insufficient documentation

## 2021-02-11 DIAGNOSIS — O26893 Other specified pregnancy related conditions, third trimester: Secondary | ICD-10-CM | POA: Insufficient documentation

## 2021-02-11 DIAGNOSIS — N1330 Unspecified hydronephrosis: Secondary | ICD-10-CM | POA: Insufficient documentation

## 2021-02-11 DIAGNOSIS — O99891 Other specified diseases and conditions complicating pregnancy: Secondary | ICD-10-CM | POA: Insufficient documentation

## 2021-02-11 DIAGNOSIS — Z3A28 28 weeks gestation of pregnancy: Secondary | ICD-10-CM | POA: Insufficient documentation

## 2021-02-11 LAB — URINALYSIS, ROUTINE W REFLEX MICROSCOPIC
Bilirubin Urine: NEGATIVE
Glucose, UA: NEGATIVE mg/dL
Ketones, ur: NEGATIVE mg/dL
Nitrite: NEGATIVE
Protein, ur: NEGATIVE mg/dL
RBC / HPF: >50 RBC/hpf — ABNORMAL HIGH (ref 0–5)
Specific Gravity, Urine: 1.02 (ref 1.005–1.030)
pH: 6 (ref 5.0–8.0)

## 2021-02-11 MED ORDER — LACTATED RINGERS IV BOLUS
1000.0000 mL | Freq: Once | INTRAVENOUS | Status: AC
  Administered 2021-02-12: 1000 mL via INTRAVENOUS

## 2021-02-11 MED ORDER — HYDROMORPHONE HCL 1 MG/ML IJ SOLN
0.5000 mg | Freq: Once | INTRAMUSCULAR | Status: AC
  Administered 2021-02-12: 0.5 mg via INTRAVENOUS
  Filled 2021-02-11: qty 1

## 2021-02-11 NOTE — MAU Provider Note (Signed)
History     CSN: [id number]  Arrival date and time: 02/11/21 2128   Event Date/Time   First Provider Initiated Contact with Patient 02/11/21 2142      *Spanish interpreter at bedside for this encounter*   Chief Complaint  Patient presents with  . Flank Pain   HPI Alexa Alvarez is a 30 y.o. G3P1001 at [redacted]w[redacted]d who presents with flank pain. Symptoms started at 6 pm. Reports constant pain in her right flank and back. Feels like she needs to void & have a BM but can't. Rates pain 8/10. Hasn't treated symptoms. Nothing makes pain better or worse. Denies fever, nausea, dysuria, contractions, vaginal bleeding, or LOF. Reports good fetal movement. Goes to [facility]. Denies complications with this pregnancy.   OB History    Gravida  3   Para  1   Term  1   Preterm      AB      Living  1     SAB      IAB      Ectopic      Multiple      Live Births  1           Past Medical History:  Diagnosis Date  . GERD (gastroesophageal reflux disease)     Past Surgical History:  Procedure Laterality Date  . CESAREAN SECTION      Family History  Problem Relation Age of Onset  . Diabetes Mother   . Hypertension Mother     Social History   Tobacco Use  . Smoking status: Never Smoker  . Smokeless tobacco: Never Used  Vaping Use  . Vaping Use: Never used  Substance Use Topics  . Alcohol use: No  . Drug use: No    Allergies: No Known Allergies  Medications Prior to Admission  Medication Sig Dispense Refill Last Dose  . prenatal vitamin w/FE, FA (PRENATAL 1 + 1) 27-1 MG TABS tablet Take 1 tablet by mouth daily at 12 noon.   02/11/2021 at Unknown time    Review of Systems  Constitutional: Negative.   Gastrointestinal: Positive for abdominal pain.  Genitourinary: Positive for flank pain. Negative for dysuria, hematuria, vaginal bleeding and vaginal discharge.   Physical Exam   Blood pressure 118/76, pulse 73, temperature 97.9 F (36.6 C), temperature  source Oral, resp. rate 18, SpO2 99 %, unknown if currently breastfeeding.  Physical Exam Vitals and nursing note reviewed. Exam conducted with a chaperone present.  Constitutional:      General: She is in acute distress.     Appearance: Normal appearance.  HENT:     Head: Normocephalic.  Pulmonary:     Effort: Pulmonary effort is normal. No respiratory distress.  Abdominal:     Tenderness: There is no right CVA tenderness or left CVA tenderness.  Genitourinary:    Comments: Dilation: Closed Effacement (%): Thick Cervical Position: Posterior Exam by:: Jorje Guild NP  Skin:    General: Skin is warm and dry.  Neurological:     Mental Status: She is alert.  Psychiatric:        Mood and Affect: Mood normal.        Behavior: Behavior normal.    NST:  Baseline: 145 bpm, Variability: Good {> 6 bpm), Accelerations: Non-reactive but appropriate for gestational age and Decelerations: Absent  MAU Course  Procedures Results for orders placed or performed during the hospital encounter of 02/11/21 (from the past 24 hour(s))  Urinalysis, Routine w reflex microscopic  Urine, Clean Catch     Status: Abnormal   Collection Time: 02/11/21  9:52 PM  Result Value Ref Range   Color, Urine YELLOW YELLOW   APPearance CLOUDY (A) CLEAR   Specific Gravity, Urine 1.020 1.005 - 1.030   pH 6.0 5.0 - 8.0   Glucose, UA NEGATIVE NEGATIVE mg/dL   Hgb urine dipstick MODERATE (A) NEGATIVE   Bilirubin Urine NEGATIVE NEGATIVE   Ketones, ur NEGATIVE NEGATIVE mg/dL   Protein, ur NEGATIVE NEGATIVE mg/dL   Nitrite NEGATIVE NEGATIVE   Leukocytes,Ua TRACE (A) NEGATIVE   RBC / HPF >50 (H) 0 - 5 RBC/hpf   WBC, UA 6-10 0 - 5 WBC/hpf   Bacteria, UA FEW (A) NONE SEEN   Squamous Epithelial / LPF 11-20 0 - 5   Mucus PRESENT    Ca Oxalate Crys, UA PRESENT    US RENAL  Result Date: 02/11/2021 CLINICAL DATA:  Right flank pain [redacted] weeks pregnant EXAM: RENAL / URINARY TRACT ULTRASOUND COMPLETE COMPARISON:  None.  FINDINGS: Right Kidney: Renal measurements: 13.7 x 4.8 x 4.6 cm = volume: 157 mL. Echogenicity within normal limits. No mass. Mild hydronephrosis Left Kidney: Renal measurements: 10.8 x 6.5 x 5.3 cm = volume: 195 mL. Echogenicity within normal limits. No mass or hydronephrosis visualized. Bladder: Appears normal for degree of bladder distention. Other: None. IMPRESSION: 1. Mild right hydronephrosis. 2. Otherwise negative renal ultrasound Electronically Signed   By: Donavan Foil M.D.   On: 02/11/2021 23:56    MDM Category 1 fetal tracing with some irregular contractions on toco. Abdomen soft & non tender. Cervix closed/thick.   IV fluids & dilaudid given for pain. Patient reports resolution of pain.   Pt presents with right flank pain & no other symptoms. U/a with moderated hemoglobin & trace leuks. Afebrile, no CVA tenderness, and no urinary complaints. Renal u/s shows mild right hydronephrosis.  Will send urine for culture & prescribe pain medication.   Assessment and Plan   1. Right flank pain   2. [redacted] weeks gestation of pregnancy    -rx roxicodone #9  -urine culture pending -reviewed reasons to return to [place] 02/12/2021, 1:07 AM

## 2021-02-11 NOTE — Progress Notes (Signed)
Pt reports right flank pain 10/10 that started today 1800. Denies pain or burning when urinating. Denies VB, LOF, and reports +FM. Afebrile.

## 2021-02-12 MED ORDER — OXYCODONE HCL 5 MG PO TABS: 5.0000 mg | ORAL_TABLET | Freq: Three times a day (TID) | ORAL | 0 refills | Status: AC | PRN

## 2021-02-12 NOTE — Progress Notes (Signed)
PIV consult: Arrived to unit, site established by primary RN. Cancel consult.

## 2021-02-12 NOTE — Discharge Instructions (Signed)
Regrese su tiene estos sintomas: Fiebre 100.5 o mas Dolor no controlado con medicamentos  Vomito      Signos de Sports administrator Warning Signs During Pregnancy Durante el embarazo, el cuerpo pasa por muchos cambios. Algunos cambios pueden ser incmodos, pero la mayora no representa un problema grave. Sin embargo, es importante saber cundo ciertos signos y sntomas pueden indicar la presencia de un problema. Hable con su mdico acerca de su actual estado de salud y cualquier afeccin mdica que tenga. Asegrese de [employer] a los que debe estar atenta e informar. Lowry Ram modo me afecta? Signos de Sports administrator Informe al mdico si tiene alguno de los siguientes signos de advertencia:  Vahdos o sentir que va a desmayarse.  Nuseas, vmitos o diarrea que duran 24 horas o ms.  Manchado o sangrado de la vagina.  Clicos abdominales o dolor en la pelvis o la parte inferior de la espalda.  Dificultad para respirar, Buyer, retail aire o dolor en el pecho.  Dolor, hinchazn o enrojecimiento nuevos en un brazo o una pierna, o un aumento de alguno de estos sntomas.  El beb se mueve menos de lo normal o no se mueve. Tambin debe estar atento a los signos de una afeccin mdica grave llamada preeclampsia. Esto puede incluir:  Dolor de [place] intenso y punzante que no desaparece.  Cambios en la visin, como visin borrosa o doble, sensibilidad a la luz o ver puntos delante de los ojos.  Hinchazn sbita o extrema del rostro, las manos, las piernas o los pies. El Media planner causa cambios que pueden hacer que sea ms probable que contraiga una infeccin. Informe a su mdico si tiene signos de infeccin, tales como:  Fiebre.  Una secrecin vaginal con mal olor.  Dolor o ardor al Garment/textile technologist. Cmo afecta esto al beb? Durante todo el [place], siempre informe cualquier signo de advertencia de un problema al mdico. Esto puede ayudar a  prevenir las complicaciones que pueden afectar a su beb, por ejemplo:  Aumento del riesgo de nacimiento prematuro.  Infecciones que pueden transmitirse al beb.  Aumento del riesgo de muerte fetal. Siga estas instrucciones en su casa:  Use los medicamentos de venta libre y los recetados solamente como se lo haya indicado el mdico.  Cumpla con todas las visitas de seguimiento. Esto es importante.   Dnde buscar ms informacin  Office on [employer] ([place]): [email or web address]  [employer] of Obstetricians and Gynecologists ([employer] Ob[employer]etras y Youth worker): [email or web address] Comunquese con un mdico si:  Tiene algn signo de advertencia de problemas durante el embarazo.  Cualquiera de lo siguiente se aplica a usted Solicitor: ? Tiene emociones fuertes, como tristeza o ansiedad, que interfieren con el trabajo o las relaciones personales. ? Se siente en riesgo en su hogar. ? [place] productos de tabaco, alcohol o drogas y [place] para dejar de hacerlo. Solicite ayuda de inmediato si:  Tiene signos o sntomas de trabajo de parto antes de las 37 semanas de [place]. Esto incluye lo siguiente: ? Contracciones separadas unas de otras por intervalos de 5 minutos o menos, o que aumentan en frecuencia, intensidad o duracin. ? Dolor abdominal sbito y agudo o Fish farm manager. ? Chorro repentino o goteo constante de lquido proveniente de la vagina. Resumen  Siempre informe cualquier signo de advertencia a su mdico para evitar las complicaciones que pueden afectar tanto a usted como a su beb.  Hable con su mdico acerca de su actual estado de salud y cualquier afeccin mdica que tenga. Asegrese de [employer] a los que debe estar atenta e informar.  Cumpla con todas las visitas de seguimiento. Esto es importante. Esta informacin no tiene Marine scientist el consejo del mdico.  Asegrese de hacerle al mdico cualquier pregunta que tenga. Document Revised: 06/07/2020 Document Reviewed: 06/07/2020 Elsevier Patient Education  [place].   Evaluacin de los movimientos fetales Fetal Movement Counts Nombre del paciente: ________________________________________________ Alexa Alvarez estimada: ____________________  Allean Found evaluacin de los movimientos fetales? Una evaluacin de los movimientos fetales es el registro del nmero de veces que siente que el beb se mueve durante un cierto perodo de [place]. Esto tambin se puede denominar recuento de patadas fetales. Una evaluacin de movimientos fetales se recomienda a todas las embarazadas. Es posible que le indiquen que comience a Chief of Staff los movimientos fetales desde la semana 28 de [place]. Preste atencin cuando sienta que el beb est ms activo. Podr detectar los ciclos en que el beb duerme y est despierto. Tambin podr detectar que ciertas cosas hacen que su beb se mueva ms. Deber realizar una evaluacin de los movimientos fetales en las siguientes situaciones:  Cuando el beb est ms activo habitualmente.  A la [employer], todos los [place]. Un buen momento para evaluar los movimientos fetales es cuando est descansando, despus de haber comido y bebido algo. Cmo debo contar los movimientos fetales? 1. Encuentre un lugar tranquilo y cmodo. Sintese o acustese de lado. 2. Anote la fecha, la hora de inicio y de finalizacin y la cantidad de movimientos que sinti entre esas dos horas. Lleve esta informacin a las visitas de control. 3. Anote la hora de inicio cuando Scientific laboratory technician. 4. Cuente las pataditas, revoloteos, chasquidos, vueltas o pinchazos. Debe sentir al menos 12movimientos. 5. Puede dejar de contar despus de haber sentido 10 movimientos o de haber contado [employer]. Anote la hora de finalizacin. 6. Si no siente 7movimientos en 2horas, comunquese con su  mdico para obtener ms indicaciones. Es posible que el mdico quiera realizar estudios adicionales para TEFL teacher del beb. Comunquese con un mdico si:  Siente menos de 47movimientos en 2horas.  El beb no se mueve tanto como suele hacerlo. Fecha: ____________ Eda Paschal inicio: ____________ Eda Paschal finalizacin: ____________ Movimientos: ____________ Toma Copier: ____________ Eda Paschal inicio: ____________ Eda Paschal finalizacin: ____________ Movimientos: ____________ Toma Copier: ____________ Eda Paschal inicio: ____________ Eda Paschal finalizacin: ____________ Movimientos: ____________ Toma Copier: ____________ Eda Paschal inicio: ____________ Eda Paschal finalizacin: ____________ Movimientos: ____________ Toma Copier: ____________ Eda Paschal inicio: ____________ Mellody Drown de finalizacin: ____________ Movimientos: ____________ Toma Copier: ____________ Eda Paschal inicio: ____________ Mellody Drown de finalizacin: ____________ Movimientos: ____________ Toma Copier: ____________ Eda Paschal inicio: ____________ Mellody Drown de finalizacin: ____________ Movimientos: ____________ Toma Copier: ____________ Eda Paschal inicio: ____________ Eda Paschal finalizacin: ____________ Movimientos: ____________ Toma Copier: ____________ Eda Paschal inicio: ____________ Mellody Drown de finalizacin: ____________ Movimientos: ____________ Esta informacin no tiene como fin reemplazar el consejo del mdico. Asegrese de hacerle al mdico cualquier pregunta que tenga. Document Revised: 09/12/2019 Document Reviewed: 09/12/2019 Elsevier Patient Education  [place].

## 2021-02-13 LAB — CULTURE, OB URINE
Culture: <10000 — AB
Special Requests: NORMAL

## 2021-03-22 ENCOUNTER — Other Ambulatory Visit: Payer: Self-pay

## 2021-03-22 ENCOUNTER — Inpatient Hospital Stay (HOSPITAL_COMMUNITY)
Admission: AD | Admit: 2021-03-22 | Discharge: 2021-03-23 | Disposition: A | Discharge status: Home / Self Care | Payer: Self-pay | Attending: Obstetrics and Gynecology | Admitting: Obstetrics and Gynecology

## 2021-03-22 DIAGNOSIS — R197 Diarrhea, unspecified: Secondary | ICD-10-CM | POA: Insufficient documentation

## 2021-03-22 DIAGNOSIS — Z3A33 33 weeks gestation of pregnancy: Secondary | ICD-10-CM | POA: Insufficient documentation

## 2021-03-22 DIAGNOSIS — O26893 Other specified pregnancy related conditions, third trimester: Secondary | ICD-10-CM | POA: Insufficient documentation

## 2021-03-22 HISTORY — DX: Unspecified asthma, uncomplicated: J45.909

## 2021-03-22 HISTORY — DX: Chronic kidney disease, unspecified: N18.9

## 2021-03-23 ENCOUNTER — Encounter (HOSPITAL_COMMUNITY): Payer: Self-pay | Admitting: Obstetrics and Gynecology

## 2021-03-23 DIAGNOSIS — R197 Diarrhea, unspecified: Secondary | ICD-10-CM

## 2021-03-23 DIAGNOSIS — Z3A33 33 weeks gestation of pregnancy: Secondary | ICD-10-CM

## 2021-03-23 DIAGNOSIS — O99613 Diseases of the digestive system complicating pregnancy, third trimester: Secondary | ICD-10-CM

## 2021-03-23 LAB — URINALYSIS, ROUTINE W REFLEX MICROSCOPIC
Bilirubin Urine: NEGATIVE
Glucose, UA: NEGATIVE mg/dL
Hgb urine dipstick: NEGATIVE
Ketones, ur: NEGATIVE mg/dL
Leukocytes,Ua: NEGATIVE
Nitrite: NEGATIVE
Protein, ur: NEGATIVE mg/dL
Specific Gravity, Urine: 1.019 (ref 1.005–1.030)
pH: 6 (ref 5.0–8.0)

## 2021-03-23 MED ORDER — DICYCLOMINE HCL 20 MG PO TABS: 20.0000 mg | ORAL_TABLET | Freq: Two times a day (BID) | ORAL | 0 refills | Status: DC

## 2021-03-23 MED ORDER — DICYCLOMINE HCL 20 MG PO TABS
20.0000 mg | ORAL_TABLET | Freq: Once | ORAL | Status: AC
  Administered 2021-03-23: 20 mg via ORAL
  Filled 2021-03-23: qty 1

## 2021-03-23 NOTE — MAU Note (Signed)
Pt reports pain is better-1 diarrhea stool in last 3 hours

## 2021-03-23 NOTE — MAU Provider Note (Addendum)
Patient Alexa Alvarez 30 y.o. G3P1001  at [redacted]w[redacted]d here with complaints of diarrhea that started on Friday. She has been to the bathroom 8 times today. She denies vaginal bleeding, LOF, decreased fetal movements.  She has had an uncomplicated pregnancy. First pregnancy was complicated by c/section. She desires a VBAC and gets her care at the [facility].   She denies any change in diet, she denies eating at any restaurants, no one in her house is sick.  History     CSN: [id number]  Arrival date and time: 03/22/21 2304   Event Date/Time   First Provider Initiated Contact with Patient 03/23/21 0110      Chief Complaint  Patient presents with  . Abdominal Pain    Started at 1900  . Diarrhea    Started at 1900- 7-8 diarrhea stools   Diarrhea  This is a new problem. The current episode started yesterday. The problem occurs 5 to 10 times per day. The problem has been gradually improving. The stool consistency is described as watery. Pertinent negatives include no abdominal pain, chills, fever or vomiting. Nothing aggravates the symptoms. There are no known risk factors.    OB History     Gravida  3   Para  1   Term  1   Preterm      AB      Living  1      SAB      IAB      Ectopic      Multiple      Live Births  1           Past Medical History:  Diagnosis Date  . Asthma    as a child-no current problems  . Chronic kidney disease    kidney stone history  . GERD (gastroesophageal reflux disease)     Past Surgical History:  Procedure Laterality Date  . CESAREAN SECTION    . FINGER SURGERY Left    index finger-abcess-nail removed    Family History  Problem Relation Age of Onset  . Diabetes Mother   . Hypertension Mother     Social History   Tobacco Use  . Smoking status: Never Smoker  . Smokeless tobacco: Never Used  Vaping Use  . Vaping Use: Never used  Substance Use Topics  . Alcohol use: No  . Drug use: No    Allergies: No Known  Allergies  Medications Prior to Admission  Medication Sig Dispense Refill Last Dose  . prenatal vitamin w/FE, FA (PRENATAL 1 + 1) 27-1 MG TABS tablet Take 1 tablet by mouth daily at 12 noon.   03/23/2021 at Unknown time    Review of Systems  Constitutional: Negative for chills and fever.  Gastrointestinal: Positive for diarrhea. Negative for abdominal pain and vomiting.   Physical Exam   Blood pressure 122/72, pulse 86, temperature 98 F (36.7 C), temperature source Oral, resp. rate 17, weight 85.3 kg, SpO2 100 %, unknown if currently breastfeeding.  Physical Exam Constitutional:      Appearance: She is well-developed.  Abdominal:     Tenderness: There is no abdominal tenderness. Negative signs include Murphy's sign and McBurney's sign.  Neurological:     Mental Status: She is alert.     MAU Course  Procedures  MDM -UA normal; patient is afebrile and CBC not done. Patient is overall well appearing.  -NST : 135 bpm, mod variability, present acel, no decels, patient has uterine irratability/contractions that she does not  feel and does not think is labor. Therefore, patient declines cervical exam -Patient had one episode of diarrhea while in MAU. -Bentyl offered, with moderate relief.  She is well appearing and in no pain at the end of her visit; desires discharge.   Assessment and Plan   1. Diarrhea, unspecified type    -discussed diet for diarrhea, hydration, and when to return to MAU (diarrhea becomes bloody, fever, weakness, chills, third trimester concerns) -Rx for Bentyl given, also discuss immodium -keep appt at [facility] on Monday -Return to MAU as needed [place] 03/23/2021, 1:11 AM

## 2021-03-23 NOTE — MAU Note (Signed)
Pt arrived with complaints of abdominal pains and diarrhea started Friday and became more severe today at 1900. Pt reports 7-8 diarrhea stools since 1900. Pt also reports decreased appetite and nausea. Pt denies cramping or contractions, SROM, vaginal bleeding or discharge. Pt endorses + fetal movement.

## 2021-03-23 NOTE — Discharge Instructions (Signed)
Diarrea en los adultos Diarrhea, Adult La diarrea ocurre cuando se hace materia fecal (heces) blanda y acuosa con frecuencia. La diarrea puede hacerlo sentir dbil y hacer que pierda el agua del cuerpo (deshidratarlo). La prdida del agua del cuerpo puede hacer:  Que sienta cansancio y sed.  Que tenga la boca seca.  Que haga pis (orine) con menos frecuencia. La diarrea suele durar 2 o 3das. Sin embargo, puede durar ms tiempo si se trata de un signo de algo ms serio. Es importante tratar la diarrea como se lo haya indicado el mdico. Siga estas indicaciones en su casa: Comida y bebida Siga estas indicaciones como se lo haya indicado el mdico:  Tome una SRO (solucin de rehidratacin oral). Esta es una bebida que ayuda a [employer] lquidos y los minerales que el cuerpo perdi. Se vende en farmacias y tiendas.  Beba abundantes lquidos, tales como: ? [place]. ? Trocitos de hielo. ? Jugo de frutas diluido. ? Bebidas deportivas de bajas caloras. ? Leche, si lo desea.  Evite beber lquidos con alto contenido de azcar o cafena.  En la medida en que pueda, consuma alimentos blandos y fciles de digerir en pequeas cantidades. Estos alimentos incluyen: ? Bananas. ? Pur de [employer]. ? Arroz. ? Carnes bajas en grasa Dorothea Glassman). ? Pan tostado. ? Galletas.  Evite tomar alcohol.  Evite los alimentos condimentados o con alto contenido de [place].      Medicamentos  [employer] de venta libre y los recetados solamente como se lo haya indicado el mdico.  Si le recetaron un antibitico, tmelo como se lo haya indicado el mdico. No deje de usar el antibitico aunque comience a Sports administrator. Indicaciones generales  Lvese las manos frecuentemente usando agua y [place]. Use desinfectante para manos si no dispone de [place] y [place]. Las International Paper de la casa deben lavarse las manos tambin. Las manos deben lavarse: ? Despus de usar el bao o cambiar un paal. ? Antes de  preparar, cocinar o servir la comida. ? Mientras cuida de una persona enferma. ? Cuando visita a alguien en el hospital.  Beba suficiente lquido para mantener la orina de color amarillo plido.  Descanse en su casa hasta sentirse mejor.  Controle su afeccin para Actuary cambio.  Tome un bao con agua tibia para Writer ardor o dolor a causa de la diarrea.  Concurra a todas las visitas de control como se lo haya indicado el mdico. Esto es importante.   Comunquese con un mdico si:  Tiene fiebre.  La diarrea empeora.  Aparecen nuevos sntomas.  No puede retener los lquidos.  Se siente aturdido o mareado.  Tiene dolor de [place].  Tiene calambres musculares. Solicite ayuda inmediatamente si:  Electronics engineer.  Se siente muy dbil o se desvanece (se desmaya).  Hace materia fecal con sangre, de color negro o que parece alquitrn.  Tiene dolor muy intenso en el vientre (abdomen), clicos o meteorismo.  Tiene problemas para respirar o respira muy rpidamente.  Su corazn late muy rpidamente.  Siente la piel fra y hmeda.  Se siente confundido.  Tiene signos de haber perdido [place] cantidad excesiva de agua del cuerpo, como: ? [place], muy escasa o falta de [place]. ? Labios agrietados. ? Sequedad de boca. ? Ojos hundidos. ? Somnolencia. ? Debilidad. Resumen  La diarrea ocurre cuando se hace materia fecal (heces) blanda y acuosa con frecuencia.  La diarrea puede hacerlo sentir dbil y Field seismologist que pierda  el agua del cuerpo (deshidratarlo).  Tome una SRO (solucin de rehidratacin oral). Es Ardelia Mems bebida que se vende en farmacias y tiendas.  En la medida en que pueda, consuma alimentos blandos y fciles de digerir en pequeas cantidades.  Comunquese con un mdico si su afeccin empeora. Solicite ayuda de inmediato si tiene signos de Risk manager perdido [place] cantidad excesiva de agua del cuerpo. Esta informacin no tiene Hydrologist el consejo del mdico. Asegrese de hacerle al mdico cualquier pregunta que tenga. Document Revised: 05/23/2018 Document Reviewed: 05/23/2018 Elsevier Patient Education  2021 [place] de alimentos para ayudar a Public house manager la diarrea en [place] to Help Relieve Diarrhea, Adult La diarrea puede hacerlo sentir dbil y deshidratarlo. Es Museum/gallery curator los alimentos y las bebidas que sean adecuados para lo siguiente:  Holiday representative.  Remplazar los lquidos y nutrientes perdidos.  Evitar la deshidratacin. Consejos para seguir Insurance claims handler de la diarrea  Evite los alimentos que empeoren la diarrea. Estos incluyen: ? Alimentos y bebidas endulzados con jarabe de maz de alto contenido de fructosa, miel o endulzantes, como xilitol, sorbitol y manitol. ? Comidas fritas, grasosas o condimentadas. ? Lambert Mody y verduras crudas.  Consuma alimentos con alto contenido de probiticos. Entre estos alimentos, se incluyen el yogur y los productos fermentados de [place]. Los probiticos pueden contribuir a Biochemist, clinical cantidad de bacterias saludables del estmago y de los intestinos (tubo digestivo o tracto gastrointestinal [GI]). Esto puede favorecer la digestin y Futures trader.  Si tiene intolerancia a la lactosa, evite los productos lcteos. Estos podran empeorar la diarrea.  Tome los medicamentos para Ambulance person diarrea solo como se lo haya indicado el mdico. Reemplazo de nutrientes  Consuma alimentos blandos y fciles de Publishing copy en pequeas cantidades, en la medida en que pueda, hasta que la diarrea comience a Teacher, English as a foreign language. Estos alimentos incluyen bananas, compota de [place], [place], tostadas y galletas saladas.  De forma gradual, reincorpore alimentos con alto contenido de nutrientes segn lo tolere o segn se lo haya indicado el mdico. Esto puede comprender lo siguiente: ? Alimentos proteicos bien cocidos, como huevos, carnes [place], como  pescado o pollo sin piel, y tofu. ? Frutas y verduras peladas, sin semillas y apenas cocidas. ? Productos lcteos con bajo contenido de grasa. ? Cereales integrales.  Tome suplementos de vitaminas y [employer] se lo haya indicado el mdico.   Product/process development scientist la deshidratacin  Comience bebiendo pequeos sorbos de agua o de una solucin para evitar la deshidratacin(solucin de rehidratacin oral, SRO). Esta es una bebida que ayuda a [employer] lquidos y los minerales que el cuerpo perdi. Puede comprar una SRO en farmacias y tiendas minoristas.  Intente beber, al menos, entre [id number] (2,000-2,533ml) de lquidos todos los das para ayudar a [employer] lquidos perdidos. Si tiene la orina de color amarillo plido, est recibiendo la cantidad suficiente de lquidos.  Puede beber otros lquidos adems de agua, como jugo de frutas con agregado de agua (jugo de fruta diluido) o bebidas deportivas de bajas caloras, segn lo tolere o segn las indicaciones del mdico.  Evite consumir bebidas con cafena, como caf, t o gaseosas.  Evite tomar alcohol.   Resumen  Si tiene [place], es importante que elija los alimentos y las bebidas que sean adecuados para Public house manager la diarrea, para reponer los lquidos y los nutrientes perdidos, y para Environmental manager.  Debe beber suficiente cantidad de lquido para mantener la orina de [employer]  plido.  Al principio, puede ser beneficioso que consuma alimentos blandos. De forma gradual, reincorpore alimentos saludables, con alto contenido de nutrientes, segn lo tolere o segn se lo haya indicado el mdico.  Evite los alimentos que [employer] diarrea, como los alimentos fritos, grasosos o condimentados. Esta informacin no tiene Marine scientist el consejo del mdico. Asegrese de hacerle al mdico cualquier pregunta que tenga. Document Revised: 03/11/2020 Document Reviewed: 03/11/2020 Elsevier Patient Education  2021 [employer].

## 2021-03-23 NOTE — MAU Provider Note (Incomplete)
Patient Alexa Alvarez 30 y.o. G3P1001  at [redacted]w[redacted]d here with complaints of diarrhea that started on Friday. She has been to the bathroom 8 times today. She denies vaginal bleeding, LOF, decreased fetal movements. She does not think she is dehydrated, she does not think she is having preterm labor. Her abdominal pain does not feel like contractions to her.   She denies any change in diet, she denies eating at any restaurants, no one in her house is sick.  History     CSN: [id number]  Arrival date and time: 03/22/21 2304   Event Date/Time   First Provider Initiated Contact with Patient 03/23/21 0110      Chief Complaint  Patient presents with  . Abdominal Pain    Started at 1900  . Diarrhea    Started at 1900- 7-8 diarrhea stools   Diarrhea  This is a new problem. The current episode started yesterday. The problem occurs 5 to 10 times per day. The problem has been gradually improving. The stool consistency is described as watery. Pertinent negatives include no abdominal pain, chills, fever or vomiting. Nothing aggravates the symptoms. There are no known risk factors.    OB History    Gravida  3   Para  1   Term  1   Preterm      AB      Living  1     SAB      IAB      Ectopic      Multiple      Live Births  1           Past Medical History:  Diagnosis Date  . Asthma    as a child-no current problems  . Chronic kidney disease    kidney stone history  . GERD (gastroesophageal reflux disease)     Past Surgical History:  Procedure Laterality Date  . CESAREAN SECTION    . FINGER SURGERY Left    index finger-abcess-nail removed    Family History  Problem Relation Age of Onset  . Diabetes Mother   . Hypertension Mother     Social History   Tobacco Use  . Smoking status: Never Smoker  . Smokeless tobacco: Never Used  Vaping Use  . Vaping Use: Never used  Substance Use Topics  . Alcohol use: No  . Drug use: No    Allergies: No Known  Allergies  Medications Prior to Admission  Medication Sig Dispense Refill Last Dose  . prenatal vitamin w/FE, FA (PRENATAL 1 + 1) 27-1 MG TABS tablet Take 1 tablet by mouth daily at 12 noon.   03/23/2021 at Unknown time    Review of Systems  Constitutional: Negative for chills and fever.  Gastrointestinal: Positive for diarrhea. Negative for abdominal pain and vomiting.   Physical Exam   Blood pressure 122/72, pulse 86, temperature 98 F (36.7 C), temperature source Oral, resp. rate 17, weight 85.3 kg, SpO2 100 %, unknown if currently breastfeeding.  Physical Exam Constitutional:      Appearance: She is well-developed.  Abdominal:     Tenderness: There is no abdominal tenderness. Negative signs include Murphy's sign and McBurney's sign.  Neurological:     Mental Status: She is alert.     MAU Course  Procedures  MDM -UA normal -NST : 140 bpm, mod var, present acel, no decels, uterine irratability -patient declines cervical exam; she is well-appearing in bed and does not think she needs cervical check -Bentyl given  Assessment and Plan    -patient stable for discharge, reviewed diet for diarrhea, reviewed when to return to MAU -keep appt at [facility] on Monday (tomorrow) Mervyn Skeeters [place]ra 03/23/2021, 1:11 AM

## 2021-04-06 ENCOUNTER — Inpatient Hospital Stay (HOSPITAL_COMMUNITY)
Admission: AD | Admit: 2021-04-06 | Discharge: 2021-04-07 | Disposition: A | Discharge status: Home / Self Care | Payer: Self-pay | Attending: Obstetrics & Gynecology | Admitting: Obstetrics & Gynecology

## 2021-04-06 ENCOUNTER — Inpatient Hospital Stay (HOSPITAL_BASED_OUTPATIENT_CLINIC_OR_DEPARTMENT_OTHER): Payer: Self-pay

## 2021-04-06 ENCOUNTER — Other Ambulatory Visit: Payer: Self-pay

## 2021-04-06 ENCOUNTER — Encounter (HOSPITAL_COMMUNITY): Payer: Self-pay | Admitting: Obstetrics & Gynecology

## 2021-04-06 DIAGNOSIS — R1032 Left lower quadrant pain: Secondary | ICD-10-CM | POA: Insufficient documentation

## 2021-04-06 DIAGNOSIS — Z79899 Other long term (current) drug therapy: Secondary | ICD-10-CM | POA: Insufficient documentation

## 2021-04-06 DIAGNOSIS — O4693 Antepartum hemorrhage, unspecified, third trimester: Secondary | ICD-10-CM | POA: Insufficient documentation

## 2021-04-06 DIAGNOSIS — O26893 Other specified pregnancy related conditions, third trimester: Secondary | ICD-10-CM | POA: Insufficient documentation

## 2021-04-06 DIAGNOSIS — Z3A35 35 weeks gestation of pregnancy: Secondary | ICD-10-CM | POA: Insufficient documentation

## 2021-04-06 LAB — URINALYSIS, ROUTINE W REFLEX MICROSCOPIC
Bilirubin Urine: NEGATIVE
Glucose, UA: NEGATIVE mg/dL
Ketones, ur: NEGATIVE mg/dL
Leukocytes,Ua: NEGATIVE
Nitrite: NEGATIVE
Protein, ur: NEGATIVE mg/dL
Specific Gravity, Urine: 1.01 (ref 1.005–1.030)
pH: 7 (ref 5.0–8.0)

## 2021-04-06 LAB — WET PREP, GENITAL
Sperm: NONE SEEN
Trich, Wet Prep: NONE SEEN
Yeast Wet Prep HPF POC: NONE SEEN

## 2021-04-06 NOTE — MAU Provider Note (Signed)
Patient Alexa Alvarez is a 30 y.o. G3P1011  at 106w6d here with complaints of vaginal bleeding and occasional LLQ pain. She denies contractions, LOF, decreased fetal movements. Last intercourse was 48 hours ago.     History     CSN: [id number]  Arrival date and time: 04/06/21 2149   None     Chief Complaint  Patient presents with  . Vaginal Bleeding   Vaginal Bleeding The patient's primary symptoms include vaginal bleeding. This is a new problem. The current episode started today. The problem occurs intermittently. The problem has been unchanged. Pertinent negatives include no abdominal pain, constipation, diarrhea, dysuria, fever, sore throat, urgency or vomiting. The vaginal discharge was bloody. There has been no bleeding. She has not been passing clots. She has not been passing tissue.   She reports one small clot and bright red and now occasional spotting that is dark brown.  OB History    Gravida  3   Para  1   Term  1   Preterm      AB  1   Living  1     SAB  1   IAB      Ectopic      Multiple      Live Births  1           Past Medical History:  Diagnosis Date  . Asthma    as a child-no current problems  . Chronic kidney disease    kidney stone history  . GERD (gastroesophageal reflux disease)     Past Surgical History:  Procedure Laterality Date  . CESAREAN SECTION    . FINGER SURGERY Left    index finger-abcess-nail removed    Family History  Problem Relation Age of Onset  . Diabetes Mother   . Hypertension Mother     Social History   Tobacco Use  . Smoking status: Never Smoker  . Smokeless tobacco: Never Used  Vaping Use  . Vaping Use: Never used  Substance Use Topics  . Alcohol use: No  . Drug use: No    Allergies: No Known Allergies  Medications Prior to Admission  Medication Sig Dispense Refill Last Dose  . dicyclomine (BENTYL) 20 MG tablet Take 1 tablet (20 mg total) by mouth 2 (two) times daily. 20 tablet  0 04/06/2021 at Unknown time  . prenatal vitamin w/FE, FA (PRENATAL 1 + 1) 27-1 MG TABS tablet Take 1 tablet by mouth daily at 12 noon.   04/06/2021 at Unknown time    Review of Systems  Constitutional: Negative.  Negative for fever.  HENT: Negative.  Negative for sore throat.   Respiratory: Negative.   Gastrointestinal: Negative for abdominal pain, constipation, diarrhea and vomiting.  Genitourinary: Positive for vaginal bleeding. Negative for dysuria and urgency.   Physical Exam   Blood pressure 116/78, pulse 79, temperature 98 F (36.7 C), resp. rate 18, unknown if currently breastfeeding.  Physical Exam Constitutional:      Appearance: Normal appearance.  Cardiovascular:     Rate and Rhythm: Normal rate.     Pulses: Normal pulses.  Pulmonary:     Effort: Pulmonary effort is normal.  Abdominal:     General: Abdomen is flat.  Genitourinary:    General: Normal vulva.     Vagina: Vaginal discharge present.     Comments: NEFG; dark brown discharge in the vagina, no clots, no active bleeding.  Musculoskeletal:        General: Normal range of  motion.  Skin:    General: Skin is warm.  Neurological:     Mental Status: She is alert.   Abdomen is soft, non-tender.   MAU Course  Procedures  MDM -NST: 140 bpm, mod var, present acel, no decels, uterine irratability.  -Patient had OB Limited US, no evidence of previa or abruption. She reports now that her pain is less and that she is bleeding less. She declines cervical exam.   Assessment and Plan   1. Vaginal bleeding in pregnancy, third trimester    2. Patient stable for discharge with return precautions. Reviewed bleeding precautions, signs of labor, signs of abruption. Recommended that patient abstain from intercourse for at least one more week (until 37 weeks).  3. Patient to keep [facility] appt on Tuesday, May 9.  4. All questions answered, patient and partner verbalized understanding.   Mervyn Skeeters Janelie Goltz 04/06/2021,  11:17 PM

## 2021-04-06 NOTE — MAU Note (Signed)
Pt reports about 8pm tonight she felt something leaking. She went to the BR and had some blood leaking out and in her underwear. She put a pad on and checked in another hour and just a few drops  on it. Good fetal movement felt. Had a pain in her back when she fetl the leaking but no pain now. Was told she had a placenta previa earlier in pregnancy but resolved at 7 months

## 2021-04-07 DIAGNOSIS — Z3A36 36 weeks gestation of pregnancy: Secondary | ICD-10-CM

## 2021-04-07 DIAGNOSIS — O4693 Antepartum hemorrhage, unspecified, third trimester: Secondary | ICD-10-CM

## 2021-04-07 DIAGNOSIS — Z3A35 35 weeks gestation of pregnancy: Secondary | ICD-10-CM

## 2021-04-07 DIAGNOSIS — R102 Pelvic and perineal pain: Secondary | ICD-10-CM

## 2021-04-07 DIAGNOSIS — O26893 Other specified pregnancy related conditions, third trimester: Secondary | ICD-10-CM

## 2021-04-07 LAB — GC/CHLAMYDIA PROBE AMP (~~LOC~~) NOT AT ARMC
Chlamydia: NEGATIVE
Comment: NEGATIVE
Comment: NORMAL
Neisseria Gonorrhea: NEGATIVE

## 2021-04-08 LAB — OB RESULTS CONSOLE GBS: GBS: NEGATIVE

## 2021-05-06 ENCOUNTER — Encounter (HOSPITAL_COMMUNITY): Payer: Self-pay | Admitting: *Deleted

## 2021-05-06 ENCOUNTER — Telehealth (HOSPITAL_COMMUNITY): Payer: Self-pay | Admitting: *Deleted

## 2021-05-06 NOTE — Telephone Encounter (Signed)
Preadmission screen Interpreter number [phone]

## 2021-05-07 ENCOUNTER — Other Ambulatory Visit: Payer: Self-pay | Admitting: Advanced Practice Midwife

## 2021-05-09 ENCOUNTER — Other Ambulatory Visit (HOSPITAL_COMMUNITY)
Admission: RE | Admit: 2021-05-09 | Discharge: 2021-05-09 | Disposition: A | Discharge status: Home / Self Care | Payer: Self-pay | Source: Ambulatory Visit | Attending: Obstetrics & Gynecology | Admitting: Obstetrics & Gynecology

## 2021-05-09 DIAGNOSIS — Z01812 Encounter for preprocedural laboratory examination: Secondary | ICD-10-CM | POA: Insufficient documentation

## 2021-05-09 DIAGNOSIS — Z20822 Contact with and (suspected) exposure to covid-19: Secondary | ICD-10-CM | POA: Insufficient documentation

## 2021-05-09 LAB — SARS CORONAVIRUS 2 (TAT 6-24 HRS): SARS Coronavirus 2: NEGATIVE

## 2021-05-10 ENCOUNTER — Other Ambulatory Visit: Payer: Self-pay

## 2021-05-11 ENCOUNTER — Encounter (HOSPITAL_COMMUNITY)
Admission: AD | Disposition: A | Discharge status: Home / Self Care | Payer: Self-pay | Source: Home / Self Care | Attending: Obstetrics and Gynecology

## 2021-05-11 ENCOUNTER — Inpatient Hospital Stay (HOSPITAL_COMMUNITY): Payer: Medicaid Other | Admitting: Anesthesiology

## 2021-05-11 ENCOUNTER — Encounter (HOSPITAL_COMMUNITY): Payer: Self-pay | Admitting: Obstetrics and Gynecology

## 2021-05-11 ENCOUNTER — Inpatient Hospital Stay (HOSPITAL_COMMUNITY)
Admission: AD | Admit: 2021-05-11 | Discharge: 2021-05-13 | DRG: 786 | Disposition: A | Discharge status: Home / Self Care | Payer: Medicaid Other | Attending: Obstetrics and Gynecology | Admitting: Obstetrics and Gynecology

## 2021-05-11 ENCOUNTER — Inpatient Hospital Stay (HOSPITAL_COMMUNITY): Payer: Medicaid Other

## 2021-05-11 ENCOUNTER — Inpatient Hospital Stay (HOSPITAL_BASED_OUTPATIENT_CLINIC_OR_DEPARTMENT_OTHER): Payer: Medicaid Other

## 2021-05-11 DIAGNOSIS — O4593 Premature separation of placenta, unspecified, third trimester: Secondary | ICD-10-CM | POA: Diagnosis present

## 2021-05-11 DIAGNOSIS — O9081 Anemia of the puerperium: Secondary | ICD-10-CM | POA: Diagnosis not present

## 2021-05-11 DIAGNOSIS — O480 Post-term pregnancy: Principal | ICD-10-CM | POA: Diagnosis present

## 2021-05-11 DIAGNOSIS — Z3A41 41 weeks gestation of pregnancy: Secondary | ICD-10-CM

## 2021-05-11 DIAGNOSIS — O4693 Antepartum hemorrhage, unspecified, third trimester: Secondary | ICD-10-CM

## 2021-05-11 DIAGNOSIS — D62 Acute posthemorrhagic anemia: Secondary | ICD-10-CM | POA: Diagnosis not present

## 2021-05-11 DIAGNOSIS — O9962 Diseases of the digestive system complicating childbirth: Secondary | ICD-10-CM | POA: Diagnosis present

## 2021-05-11 DIAGNOSIS — Z369 Encounter for antenatal screening, unspecified: Secondary | ICD-10-CM

## 2021-05-11 DIAGNOSIS — K219 Gastro-esophageal reflux disease without esophagitis: Secondary | ICD-10-CM | POA: Diagnosis present

## 2021-05-11 DIAGNOSIS — Z3A40 40 weeks gestation of pregnancy: Secondary | ICD-10-CM

## 2021-05-11 DIAGNOSIS — Z789 Other specified health status: Secondary | ICD-10-CM | POA: Diagnosis present

## 2021-05-11 DIAGNOSIS — O34211 Maternal care for low transverse scar from previous cesarean delivery: Secondary | ICD-10-CM | POA: Diagnosis present

## 2021-05-11 DIAGNOSIS — Z98891 History of uterine scar from previous surgery: Secondary | ICD-10-CM

## 2021-05-11 LAB — CBC
HCT: 29.4 % — ABNORMAL LOW (ref 36.0–46.0)
HCT: 30.6 % — ABNORMAL LOW (ref 36.0–46.0)
HCT: 34.8 % — ABNORMAL LOW (ref 36.0–46.0)
Hemoglobin: 10.1 g/dL — ABNORMAL LOW (ref 12.0–15.0)
Hemoglobin: 11.1 g/dL — ABNORMAL LOW (ref 12.0–15.0)
Hemoglobin: 9.4 g/dL — ABNORMAL LOW (ref 12.0–15.0)
MCH: 27.6 pg (ref 26.0–34.0)
MCH: 27.8 pg (ref 26.0–34.0)
MCH: 28.3 pg (ref 26.0–34.0)
MCHC: 31.9 g/dL (ref 30.0–36.0)
MCHC: 32 g/dL (ref 30.0–36.0)
MCHC: 33 g/dL (ref 30.0–36.0)
MCV: 85.7 fL (ref 80.0–100.0)
MCV: 86.2 fL (ref 80.0–100.0)
MCV: 87.2 fL (ref 80.0–100.0)
Platelets: 197 10*3/uL (ref 150–400)
Platelets: 208 10*3/uL (ref 150–400)
Platelets: 228 10*3/uL (ref 150–400)
RBC: 3.41 MIL/uL — ABNORMAL LOW (ref 3.87–5.11)
RBC: 3.57 MIL/uL — ABNORMAL LOW (ref 3.87–5.11)
RBC: 3.99 MIL/uL (ref 3.87–5.11)
RDW: 14.4 % (ref 11.5–15.5)
RDW: 14.5 % (ref 11.5–15.5)
RDW: 14.6 % (ref 11.5–15.5)
WBC: 10.3 10*3/uL (ref 4.0–10.5)
WBC: 20.2 10*3/uL — ABNORMAL HIGH (ref 4.0–10.5)
WBC: 9.7 10*3/uL (ref 4.0–10.5)
nRBC: 0 % (ref 0.0–0.2)
nRBC: 0 % (ref 0.0–0.2)
nRBC: 0 % (ref 0.0–0.2)

## 2021-05-11 LAB — PROTIME-INR
INR: 1 (ref 0.8–1.2)
Prothrombin Time: 13.6 seconds (ref 11.4–15.2)

## 2021-05-11 LAB — RPR: RPR Ser Ql: NONREACTIVE

## 2021-05-11 LAB — APTT: aPTT: 26 seconds (ref 24–36)

## 2021-05-11 LAB — FIBRINOGEN: Fibrinogen: 386 mg/dL (ref 210–475)

## 2021-05-11 SURGERY — Surgical Case
Anesthesia: Spinal

## 2021-05-11 MED ORDER — TERBUTALINE SULFATE 1 MG/ML IJ SOLN: 0.2500 mg | Freq: Once | INTRAMUSCULAR | Status: DC | PRN

## 2021-05-11 MED ORDER — DEXAMETHASONE SODIUM PHOSPHATE 10 MG/ML IJ SOLN
INTRAMUSCULAR | Status: DC | PRN
  Administered 2021-05-11: 10 mg via INTRAVENOUS

## 2021-05-11 MED ORDER — ONDANSETRON HCL 4 MG/2ML IJ SOLN: 4.0000 mg | Freq: Four times a day (QID) | INTRAMUSCULAR | Status: DC | PRN

## 2021-05-11 MED ORDER — OXYTOCIN-SODIUM CHLORIDE 30-0.9 UT/500ML-% IV SOLN
INTRAVENOUS | Status: AC
  Filled 2021-05-11: qty 500

## 2021-05-11 MED ORDER — DIBUCAINE (PERIANAL) 1 % EX OINT: 1.0000 "application " | TOPICAL_OINTMENT | CUTANEOUS | Status: DC | PRN

## 2021-05-11 MED ORDER — SIMETHICONE 80 MG PO CHEW
80.0000 mg | CHEWABLE_TABLET | Freq: Three times a day (TID) | ORAL | Status: DC
  Administered 2021-05-12 (×3): 80 mg via ORAL
  Filled 2021-05-11 (×3): qty 1

## 2021-05-11 MED ORDER — DIPHENHYDRAMINE HCL 25 MG PO CAPS: 25.0000 mg | ORAL_CAPSULE | Freq: Four times a day (QID) | ORAL | Status: DC | PRN

## 2021-05-11 MED ORDER — DIPHENOXYLATE-ATROPINE 2.5-0.025 MG PO TABS
2.0000 | ORAL_TABLET | Freq: Once | ORAL | Status: AC
  Administered 2021-05-11: 2 via ORAL
  Filled 2021-05-11 (×2): qty 2

## 2021-05-11 MED ORDER — TETANUS-DIPHTH-ACELL PERTUSSIS 5-2.5-18.5 LF-MCG/0.5 IM SUSY: 0.5000 mL | PREFILLED_SYRINGE | Freq: Once | INTRAMUSCULAR | Status: DC

## 2021-05-11 MED ORDER — SCOPOLAMINE 1 MG/3DAYS TD PT72
1.0000 | MEDICATED_PATCH | Freq: Once | TRANSDERMAL | Status: DC
  Administered 2021-05-11: 1.5 mg via TRANSDERMAL
  Filled 2021-05-11: qty 1

## 2021-05-11 MED ORDER — NALOXONE HCL 4 MG/10ML IJ SOLN
1.0000 ug/kg/h | INTRAVENOUS | Status: DC | PRN
  Filled 2021-05-11: qty 5

## 2021-05-11 MED ORDER — ACETAMINOPHEN 325 MG PO TABS: 650.0000 mg | ORAL_TABLET | ORAL | Status: DC | PRN

## 2021-05-11 MED ORDER — MENTHOL 3 MG MT LOZG: 1.0000 | LOZENGE | OROMUCOSAL | Status: DC | PRN

## 2021-05-11 MED ORDER — CARBOPROST TROMETHAMINE 250 MCG/ML IM SOLN
250.0000 ug | INTRAMUSCULAR | Status: AC
  Administered 2021-05-11: 250 ug via INTRAMUSCULAR

## 2021-05-11 MED ORDER — NALOXONE HCL 0.4 MG/ML IJ SOLN: 0.4000 mg | INTRAMUSCULAR | Status: DC | PRN

## 2021-05-11 MED ORDER — OXYTOCIN-SODIUM CHLORIDE 30-0.9 UT/500ML-% IV SOLN
2.5000 [IU]/h | INTRAVENOUS | Status: AC
  Administered 2021-05-11: 2.5 [IU]/h via INTRAVENOUS
  Filled 2021-05-11: qty 500

## 2021-05-11 MED ORDER — ENOXAPARIN SODIUM 40 MG/0.4ML IJ SOSY
40.0000 mg | PREFILLED_SYRINGE | INTRAMUSCULAR | Status: DC
  Administered 2021-05-12: 40 mg via SUBCUTANEOUS
  Filled 2021-05-11: qty 0.4

## 2021-05-11 MED ORDER — NALBUPHINE HCL 10 MG/ML IJ SOLN: 5.0000 mg | Freq: Once | INTRAMUSCULAR | Status: DC | PRN

## 2021-05-11 MED ORDER — MORPHINE SULFATE (PF) 0.5 MG/ML IJ SOLN
INTRAMUSCULAR | Status: DC | PRN
  Administered 2021-05-11: 150 ug via INTRATHECAL

## 2021-05-11 MED ORDER — OXYTOCIN-SODIUM CHLORIDE 30-0.9 UT/500ML-% IV SOLN
INTRAVENOUS | Status: DC | PRN
  Administered 2021-05-11: 300 mL via INTRAVENOUS

## 2021-05-11 MED ORDER — SOD CITRATE-CITRIC ACID 500-334 MG/5ML PO SOLN
30.0000 mL | ORAL | Status: DC | PRN
  Administered 2021-05-11: 30 mL via ORAL
  Filled 2021-05-11: qty 15

## 2021-05-11 MED ORDER — DIPHENHYDRAMINE HCL 25 MG PO CAPS: 25.0000 mg | ORAL_CAPSULE | ORAL | Status: DC | PRN

## 2021-05-11 MED ORDER — BUPIVACAINE IN DEXTROSE 0.75-8.25 % IT SOLN
INTRATHECAL | Status: DC | PRN
  Administered 2021-05-11: 1.6 mg via INTRATHECAL

## 2021-05-11 MED ORDER — FENTANYL CITRATE (PF) 100 MCG/2ML IJ SOLN
INTRAMUSCULAR | Status: AC
  Filled 2021-05-11: qty 2

## 2021-05-11 MED ORDER — IBUPROFEN 600 MG PO TABS
600.0000 mg | ORAL_TABLET | Freq: Four times a day (QID) | ORAL | Status: DC
  Administered 2021-05-11 – 2021-05-13 (×8): 600 mg via ORAL
  Filled 2021-05-11 (×8): qty 1

## 2021-05-11 MED ORDER — DEXAMETHASONE SODIUM PHOSPHATE 10 MG/ML IJ SOLN
INTRAMUSCULAR | Status: AC
  Filled 2021-05-11: qty 1

## 2021-05-11 MED ORDER — LACTATED RINGERS IV SOLN: INTRAVENOUS | Status: DC

## 2021-05-11 MED ORDER — TRANEXAMIC ACID-NACL 1000-0.7 MG/100ML-% IV SOLN
INTRAVENOUS | Status: DC | PRN
  Administered 2021-05-11: 1000 mg via INTRAVENOUS

## 2021-05-11 MED ORDER — LOPERAMIDE HCL 2 MG PO CAPS: 4.0000 mg | ORAL_CAPSULE | Freq: Once | ORAL | Status: DC

## 2021-05-11 MED ORDER — SODIUM CHLORIDE 0.9% FLUSH: 3.0000 mL | INTRAVENOUS | Status: DC | PRN

## 2021-05-11 MED ORDER — MORPHINE SULFATE (PF) 0.5 MG/ML IJ SOLN
INTRAMUSCULAR | Status: AC
  Filled 2021-05-11: qty 10

## 2021-05-11 MED ORDER — SODIUM CHLORIDE 0.9 % IR SOLN
Status: DC | PRN
  Administered 2021-05-11: 1000 mL

## 2021-05-11 MED ORDER — ONDANSETRON HCL 4 MG/2ML IJ SOLN
INTRAMUSCULAR | Status: AC
  Filled 2021-05-11: qty 2

## 2021-05-11 MED ORDER — METHYLERGONOVINE MALEATE 0.2 MG/ML IJ SOLN
INTRAMUSCULAR | Status: AC
  Filled 2021-05-11: qty 1

## 2021-05-11 MED ORDER — SENNOSIDES-DOCUSATE SODIUM 8.6-50 MG PO TABS
2.0000 | ORAL_TABLET | Freq: Every day | ORAL | Status: DC
  Administered 2021-05-12 – 2021-05-13 (×2): 2 via ORAL
  Filled 2021-05-11 (×2): qty 2

## 2021-05-11 MED ORDER — LIDOCAINE HCL (PF) 1 % IJ SOLN: 30.0000 mL | INTRAMUSCULAR | Status: DC | PRN

## 2021-05-11 MED ORDER — FENTANYL CITRATE (PF) 100 MCG/2ML IJ SOLN
25.0000 ug | INTRAMUSCULAR | Status: DC | PRN
  Administered 2021-05-11: 50 ug via INTRAVENOUS

## 2021-05-11 MED ORDER — CARBOPROST TROMETHAMINE 250 MCG/ML IM SOLN
INTRAMUSCULAR | Status: AC
  Filled 2021-05-11: qty 1

## 2021-05-11 MED ORDER — TRANEXAMIC ACID-NACL 1000-0.7 MG/100ML-% IV SOLN
INTRAVENOUS | Status: AC
  Filled 2021-05-11: qty 100

## 2021-05-11 MED ORDER — OXYCODONE-ACETAMINOPHEN 5-325 MG PO TABS
1.0000 | ORAL_TABLET | ORAL | Status: DC | PRN
Start: 2021-05-11 — End: 2021-05-13

## 2021-05-11 MED ORDER — FENTANYL CITRATE (PF) 100 MCG/2ML IJ SOLN
100.0000 ug | INTRAMUSCULAR | Status: DC | PRN
  Administered 2021-05-11: 15 ug via INTRAVENOUS

## 2021-05-11 MED ORDER — SIMETHICONE 80 MG PO CHEW: 80.0000 mg | CHEWABLE_TABLET | ORAL | Status: DC | PRN

## 2021-05-11 MED ORDER — ONDANSETRON HCL 4 MG/2ML IJ SOLN
4.0000 mg | Freq: Three times a day (TID) | INTRAMUSCULAR | Status: DC | PRN
  Administered 2021-05-11: 4 mg via INTRAVENOUS
  Filled 2021-05-11: qty 2

## 2021-05-11 MED ORDER — OXYTOCIN-SODIUM CHLORIDE 30-0.9 UT/500ML-% IV SOLN: 2.5000 [IU]/h | INTRAVENOUS | Status: DC

## 2021-05-11 MED ORDER — PHENYLEPHRINE HCL-NACL 20-0.9 MG/250ML-% IV SOLN
INTRAVENOUS | Status: DC | PRN
  Administered 2021-05-11: 60 ug/min via INTRAVENOUS

## 2021-05-11 MED ORDER — LACTATED RINGERS IV SOLN: INTRAVENOUS | Status: AC

## 2021-05-11 MED ORDER — NALBUPHINE HCL 10 MG/ML IJ SOLN: 5.0000 mg | INTRAMUSCULAR | Status: DC | PRN

## 2021-05-11 MED ORDER — FERROUS SULFATE 325 (65 FE) MG PO TABS
325.0000 mg | ORAL_TABLET | ORAL | Status: DC
  Administered 2021-05-13: 325 mg via ORAL
  Filled 2021-05-11: qty 1

## 2021-05-11 MED ORDER — CEFAZOLIN SODIUM-DEXTROSE 2-3 GM-%(50ML) IV SOLR
INTRAVENOUS | Status: DC | PRN
  Administered 2021-05-11: 2 g via INTRAVENOUS

## 2021-05-11 MED ORDER — METHYLERGONOVINE MALEATE 0.2 MG/ML IJ SOLN
INTRAMUSCULAR | Status: DC | PRN
  Administered 2021-05-11: .2 mg via INTRAMUSCULAR

## 2021-05-11 MED ORDER — ONDANSETRON HCL 4 MG/2ML IJ SOLN
INTRAMUSCULAR | Status: DC | PRN
  Administered 2021-05-11: 4 mg via INTRAVENOUS

## 2021-05-11 MED ORDER — COCONUT OIL OIL: 1.0000 "application " | TOPICAL_OIL | Status: DC | PRN

## 2021-05-11 MED ORDER — OXYCODONE-ACETAMINOPHEN 5-325 MG PO TABS: 2.0000 | ORAL_TABLET | ORAL | Status: DC | PRN

## 2021-05-11 MED ORDER — OXYTOCIN-SODIUM CHLORIDE 30-0.9 UT/500ML-% IV SOLN
1.0000 m[IU]/min | INTRAVENOUS | Status: DC
  Administered 2021-05-11: 2 m[IU]/min via INTRAVENOUS
  Filled 2021-05-11: qty 500

## 2021-05-11 MED ORDER — LACTATED RINGERS IV SOLN: 500.0000 mL | INTRAVENOUS | Status: DC | PRN

## 2021-05-11 MED ORDER — DIPHENHYDRAMINE HCL 50 MG/ML IJ SOLN: 12.5000 mg | INTRAMUSCULAR | Status: DC | PRN

## 2021-05-11 MED ORDER — WITCH HAZEL-GLYCERIN EX PADS: 1.0000 "application " | MEDICATED_PAD | CUTANEOUS | Status: DC | PRN

## 2021-05-11 MED ORDER — KETOROLAC TROMETHAMINE 30 MG/ML IJ SOLN: 30.0000 mg | Freq: Once | INTRAMUSCULAR | Status: DC | PRN

## 2021-05-11 MED ORDER — CEFAZOLIN SODIUM-DEXTROSE 2-4 GM/100ML-% IV SOLN
INTRAVENOUS | Status: AC
  Filled 2021-05-11: qty 100

## 2021-05-11 MED ORDER — KETOROLAC TROMETHAMINE 30 MG/ML IJ SOLN
INTRAMUSCULAR | Status: AC
  Filled 2021-05-11: qty 1

## 2021-05-11 MED ORDER — OXYCODONE-ACETAMINOPHEN 5-325 MG PO TABS: 1.0000 | ORAL_TABLET | ORAL | Status: DC | PRN

## 2021-05-11 MED ORDER — PRENATAL MULTIVITAMIN CH
1.0000 | ORAL_TABLET | Freq: Every day | ORAL | Status: DC
  Administered 2021-05-12 – 2021-05-13 (×2): 1 via ORAL
  Filled 2021-05-11 (×2): qty 1

## 2021-05-11 MED ORDER — OXYTOCIN BOLUS FROM INFUSION: 333.0000 mL | Freq: Once | INTRAVENOUS | Status: DC

## 2021-05-11 MED ORDER — PROMETHAZINE HCL 25 MG/ML IJ SOLN: 6.2500 mg | INTRAMUSCULAR | Status: DC | PRN

## 2021-05-11 SURGICAL SUPPLY — 28 items
BENZOIN TINCTURE PRP APPL 2/3 (GAUZE/BANDAGES/DRESSINGS) ×2 IMPLANT
CHLORAPREP W/TINT 26ML (MISCELLANEOUS) ×2 IMPLANT
CLAMP CORD UMBIL (MISCELLANEOUS) IMPLANT
CLOSURE STERI STRIP 1/2 X4 (GAUZE/BANDAGES/DRESSINGS) ×2 IMPLANT
DRSG OPSITE POSTOP 4X10 (GAUZE/BANDAGES/DRESSINGS) ×2 IMPLANT
ELECT REM PT RETURN 9FT ADLT (ELECTROSURGICAL) ×2
ELECTRODE REM PT RTRN 9FT ADLT (ELECTROSURGICAL) ×1 IMPLANT
EXTRACTOR VACUUM M CUP 4 TUBE (SUCTIONS) IMPLANT
GLOVE BIOGEL PI IND STRL 6.5 (GLOVE) ×1 IMPLANT
GLOVE BIOGEL PI IND STRL 7.0 (GLOVE) ×1 IMPLANT
GLOVE BIOGEL PI INDICATOR 6.5 (GLOVE) ×1
GLOVE BIOGEL PI INDICATOR 7.0 (GLOVE) ×1
GLOVE SURG SS PI 6.5 STRL IVOR (GLOVE) ×2 IMPLANT
GOWN STRL REUS W/TWL LRG LVL3 (GOWN DISPOSABLE) ×4 IMPLANT
KIT ABG SYR 3ML LUER SLIP (SYRINGE) IMPLANT
NEEDLE HYPO 25X5/8 SAFETYGLIDE (NEEDLE) IMPLANT
NS IRRIG 1000ML POUR BTL (IV SOLUTION) ×2 IMPLANT
PACK C SECTION WH (CUSTOM PROCEDURE TRAY) ×2 IMPLANT
PAD OB MATERNITY 4.3X12.25 (PERSONAL CARE ITEMS) ×2 IMPLANT
PENCIL SMOKE EVAC W/HOLSTER (ELECTROSURGICAL) ×2 IMPLANT
RTRCTR C-SECT PINK 25CM LRG (MISCELLANEOUS) IMPLANT
SUT PLAIN 0 NONE (SUTURE) IMPLANT
SUT PLAIN 2 0 XLH (SUTURE) ×2 IMPLANT
SUT VIC AB 0 CT1 36 (SUTURE) ×10 IMPLANT
SUT VIC AB 4-0 KS 27 (SUTURE) ×2 IMPLANT
TOWEL OR 17X24 6PK STRL BLUE (TOWEL DISPOSABLE) ×2 IMPLANT
TRAY FOLEY W/BAG SLVR 14FR LF (SET/KITS/TRAYS/PACK) ×2 IMPLANT
WATER STERILE IRR 1000ML POUR (IV SOLUTION) ×2 IMPLANT

## 2021-05-11 NOTE — H&P (Signed)
OBSTETRIC ADMISSION HISTORY AND PHYSICAL  Alexa Alvarez is a 30 y.o. female G3P1011 with IUP at [redacted]w[redacted]d by LMP presenting for IOL-PD, TOLAC. She reports +FMs, No LOF, no VB, no blurry vision, headaches or peripheral edema, and RUQ pain.  She plans on breast and bottle feeding. She request POPs for birth control. She received her prenatal care at [place]: By LMP --->  Estimated Date of Delivery: 05/05/21  Sono:    04/07/21@[redacted]w[redacted]d , CWD, normal anatomy, cephalic presentation, posterior placental lie   Prenatal History/Complications:  No anatomy scan on file History of c/s x1 ([place], arrest of dilation @3cm )  Past Medical History: Past Medical History:  Diagnosis Date   Asthma    as a child-no current problems   Chronic kidney disease    kidney stone history   GERD (gastroesophageal reflux disease)     Past Surgical History: Past Surgical History:  Procedure Laterality Date   CESAREAN SECTION     FINGER SURGERY Left    index finger-abcess-nail removed    Obstetrical History: OB History     Gravida  3   Para  1   Term  1   Preterm      AB  1   Living  1      SAB  1   IAB      Ectopic      Multiple      Live Births  1           Social History Social History   Socioeconomic History   Marital status: Single    Spouse name: Not on file   Number of children: Not on file   Years of education: Not on file   Highest education level: Not on file  Occupational History   Not on file  Tobacco Use   Smoking status: Never   Smokeless tobacco: Never  Vaping Use   Vaping Use: Never used  Substance and Sexual Activity   Alcohol use: No   Drug use: No   Sexual activity: Yes    Birth control/protection: None, Pill  Other Topics Concern   Not on file  Social History Narrative   Not on file   Social Determinants of Health   Financial Resource Strain: Not on file  Food Insecurity: Not on file  Transportation Needs: Not on file   Physical Activity: Not on file  Stress: Not on file  Social Connections: Not on file    Family History: Family History  Problem Relation Age of Onset   Diabetes Mother    Hypertension Mother    Breast cancer Mother     Allergies: No Known Allergies  Medications Prior to Admission  Medication Sig Dispense Refill Last Dose   prenatal vitamin w/FE, FA (PRENATAL 1 + 1) 27-1 MG TABS tablet Take 1 tablet by mouth daily at 12 noon.        Review of Systems   All systems reviewed and negative except as stated in HPI  Blood pressure 136/86, pulse 72, unknown if currently breastfeeding. General appearance: alert, cooperative, and no distress Lungs: normal respiratory effort Heart: regular rate and rhythm Abdomen: soft, non-tender; gravid Pelvic: as noted below Extremities: Homans sign is negative, no sign of DVT Presentation: cephalic by BSUS Fetal monitoringBaseline: 140 bpm, Variability: Good {> 6 bpm), Accelerations: Reactive, and Decelerations: Absent Uterine activity intermittent     Prenatal labs: ABO, Rh: --/--/PENDING (06/12 0010) Antibody: PENDING (06/12 0010) Rubella: Immune (12/06 0000)  RPR: Nonreactive (12/06 0000)  HBsAg: Negative (12/06 0000)  HIV: Non-reactive (12/06 0000)  GBS: Negative/-- (05/10 0000)  1 hr Glucola passed Genetic screening  normal Anatomy US not on file  Prenatal Transfer Tool  Maternal Diabetes: No Genetic Screening: Normal Maternal Ultrasounds/Referrals: Normal Fetal Ultrasounds or other Referrals:  None Maternal Substance Abuse:  No Significant Maternal Medications:  None Significant Maternal Lab Results: Group B Strep negative  Results for orders placed or performed during the hospital encounter of 05/11/21 (from the past 24 hour(s))  Type and screen   Collection Time: 05/11/21 12:10 AM  Result Value Ref Range   ABO/RH(D) PENDING    Antibody Screen PENDING    Sample Expiration      05/14/2021,2359 Performed at [facility], [place] [address], [place],  [address]     Patient Active Problem List   Diagnosis Date Noted   Post term pregnancy over 40 weeks 05/11/2021   GERD (gastroesophageal reflux disease) 04/01/2019    Assessment/Plan:  Alexa Alvarez is a 30 y.o. G3P1011 at [redacted]w[redacted]d here for PD-IOL.  #TOLAC/IOL: Discussed IOL process with patient. Discussed risks/benefits of TOLAC vs rLTCS, patient voiced understanding and would like to continue with TOLAC, consents signed. Given anterior cervical exam will start with low dose pitocin and place FB when able if appropriate. #Pain: PRN #FWB: Cat 1 #ID: GBS neg #MOF: both #MOC:POPs #Circ: n/a #Language barrier: stratus interpreter used for entirety of visit  Arrie Senate, MD  05/11/2021, 12:50 AM

## 2021-05-11 NOTE — Progress Notes (Signed)
Labor Progress Note Alexa Alvarez is a 30 y.o. G3P1011 at [redacted]w[redacted]d presented for IOL-PD, TOLAC.  S: On evaluation, pt noted to have ongoing moderate bright red vaginal bleeding. Pt reports mild discomfort with contractions. No other concerns at this time. Pt understands need for Cesarean if bleeding worsens or concern of fetal intolerance.  O:  BP 114/76   Pulse 82   Temp 98.2 F (36.8 C) (Oral)   Resp 18   Ht 5\' 5"  (1.651 m)   Wt 90.1 kg   BMI 33.05 kg/m  EFM: baseline 140bpm/mod variability/+accels/no decels Toco: q2-3 min  CVE: Dilation: 1 Effacement (%): Thick Cervical Position: Anterior Station: -3 Presentation: Vertex Exam by:: Sylvester Harder, MD   A&P: 30 y.o. [id number] [redacted]w[redacted]d presented for IOL-PD. Desires TOLAC. #IOL/TOLAC: Pt started on pitocin at 0124. Pt had onset of brisk vaginal bleeding at approximately 0500 (EBL 100 ml). Given ongoing bleeding observed by Dr. Elly Modena and Dr. Astrid Drafts, pitocin was discontinued and STAT order for ultrasound to evaluate location of placenta given [facility] records not available. Discussed clinical situation with pt, who understands increased likelihood of Cesarean if bleeding continues or worsens. Pt was also consented as noted below.  The risks of cesarean section were discussed with the patient including but were not limited to: bleeding which may require transfusion or reoperation; infection which may require antibiotics; injury to bowel, bladder, ureters or other surrounding organs; injury to the fetus; need for additional procedures including hysterectomy in the event of a life-threatening hemorrhage; placental abnormalities wth subsequent pregnancies, incisional problems, thromboembolic phenomenon and other postoperative/anesthesia complications.  The patient concurred with the proposed plan, giving informed written consent for the procedures.   #Pain: PRN #FWB: cat 1 strip #GBS negative #Language barrier: stratus interpreter used for  entirety of visit  Randa Ngo, MD 09:01AM

## 2021-05-11 NOTE — Progress Notes (Signed)
Called back to OR given several medium-sized clots with fundal massage. TXA, methergine, and hemabate administered. Given minimal cervical dilation prior to Cesarean, unable to perform lower uterine sweep. Vital signs stable. Ordered STAT CBC and coag panel. Will monitor closely. Dr. Elly Modena aware.  Randa Ngo, MD OB Fellow, Faculty Practice 05/11/2021 12:20 PM

## 2021-05-11 NOTE — Discharge Summary (Signed)
Postpartum Discharge Summary   Patient Name: Alexa Alvarez DOB: 12-22-1990 MRN: [id number]  Date of admission: 05/11/2021 Delivery date:05/11/2021  Delivering provider: CONSTANT, [place]  Date of discharge: 05/13/2021  Admitting diagnosis: Post term pregnancy over 40 weeks [O48.0] Intrauterine pregnancy: [redacted]w[redacted]d    Secondary diagnosis:  Principal Problem:   Cesarean delivery delivered Active Problems:   Post term pregnancy over 40 weeks   History of cesarean delivery   Language barrier  Additional problems: as noted above   Discharge diagnosis: Repeat Cesarean delivery delivered                                          Post partum procedures:blood transfusion Augmentation: Pitocin Complications: [id number] vaginal bleeding in labor (consistent with placental abruption)  Hospital course: Induction of Labor With Cesarean Section   30y.o. yo [id number] admitted to the hospital 05/11/2021 for post-dates induction of labor in the setting of prior Cesarean x1. Patient had a labor course significant for onset of brisk vaginal bleeding. Speculum exam confirmed active bleeding from the cervical os. Ultrasound confirmed no evidence of placenta previa (previously resolved). The patient went for cesarean section due to  Non-reassuring fetal status in setting of vaginal bleeding, consistent with placental abruption . Delivery details are as follows: Membrane Rupture Time/Date: 10:12 AM ,05/11/2021   Delivery Method:C-Section, Low Transverse  Details of operation can be found in separate operative Note. After delivery her Hgb dropped to 6.7 so she received 1U pRBCs. Repeat Hgb the following day showed 7.1 so she was given additional unit of pRBC, with subsequent hgb of 8.2. She was discharged home on PO iron. She is ambulating, tolerating a regular diet, passing flatus, and urinating well.  Patient is discharged home in stable condition on 05/13/21.      Newborn  Data: Birth date:05/11/2021  Birth time:10:12 AM  Gender:Female  Living status:Living  Apgars:8 ,9  Weight:3640 g                                Magnesium Sulfate received: No BMZ received: No Rhophylac:N/A MMR:N/A T-DaP:Given prenatally Flu: Yes Transfusion:Yes  Physical exam  Vitals:   05/13/21 0903 05/13/21 0936 05/13/21 1135 05/13/21 1343  BP: 126/81 126/76 127/80 125/89  Pulse: 76 91 83 99  Resp: 18 18 16 17   Temp: 98.8 F (37.1 C) 98.6 F (37 C) 98.4 F (36.9 C) 98.9 F (37.2 C)  TempSrc: Oral Oral Oral Oral  SpO2: 98% 98% 100% 100%  Weight:      Height:       General: alert, cooperative, and no distress Lochia: appropriate Uterine Fundus: firm Incision: Healing well with no significant drainage DVT Evaluation: No evidence of DVT seen on physical exam. Labs: Lab Results  Component Value Date   WBC 11.1 (H) 05/13/2021   HGB 8.2 (L) 05/13/2021   HCT 25.4 (L) 05/13/2021   MCV 87.3 05/13/2021   PLT 216 05/13/2021   CMP Latest Ref Rng & Units 05/12/2021  Glucose 65 - 99 mg/dL -  BUN 6 - 20 mg/dL -  Creatinine 0.44 - 1.00 mg/dL 0.62  Sodium 134 - 144 mmol/L -  Potassium 3.5 - 5.2 mmol/L -  Chloride 96 - 106 mmol/L -  CO2 20 - 29 mmol/L -  Calcium 8.7 -  10.2 mg/dL -  Total Protein 6.0 - 8.5 g/dL -  Total Bilirubin 0.0 - 1.2 mg/dL -  Alkaline Phos 39 - 117 IU/L -  AST 0 - 40 IU/L -  ALT 0 - 32 IU/L -   Edinburgh Score: Edinburgh Postnatal Depression Scale Screening Tool 05/13/2021  I have been able to laugh and see the funny side of things. 0  I have looked forward with enjoyment to things. 0  I have blamed myself unnecessarily when things went wrong. 0  I have been anxious or worried for no good reason. 0  I have felt scared or panicky for no good reason. 0  Things have been getting on top of me. 0  I have been so unhappy that I have had difficulty sleeping. 0  I have felt sad or miserable. 0  I have been so unhappy that I have been crying. 0  The  thought of harming myself has occurred to me. 0  Edinburgh Postnatal Depression Scale Total 0     After visit meds:  Allergies as of 05/13/2021   No Known Allergies      Medication List     TAKE these medications    acetaminophen 500 MG tablet Commonly known as: TYLENOL Take 2 tablets (1,000 mg total) by mouth every 6 (six) hours as needed for mild pain.   ferrous sulfate 325 (65 FE) MG tablet Take 1 tablet (325 mg total) by mouth every other day.   ibuprofen 600 MG tablet Commonly known as: ADVIL Take 1 tablet (600 mg total) by mouth every 6 (six) hours.   norethindrone 0.35 MG tablet Commonly known as: Ortho Micronor Take 1 tablet (0.35 mg total) by mouth daily.   oxyCODONE 5 MG immediate release tablet Commonly known as: Roxicodone Take 1 tablet (5 mg total) by mouth every 8 (eight) hours as needed.   prenatal vitamin w/FE, FA 27-1 MG Tabs tablet Take 1 tablet by mouth daily at 12 noon.   senna-docusate 8.6-50 MG tablet Commonly known as: Senokot-S Take 2 tablets by mouth daily.       Discharge home in stable condition Infant Feeding:  breast & formula Infant Disposition:home with mother Discharge instruction: per After Visit Summary and Postpartum booklet. Activity: Advance as tolerated. Pelvic rest for 6 weeks.  Diet: routine diet Future Appointments:No future appointments. Follow up Visit: Pt instructed to call [facility] prior to discharge.  Please schedule this patient for a In person postpartum visit in 6 weeks with the following provider: Any provider. Additional Postpartum F/U:Postpartum Depression checkup and Incision check 1 week  High risk pregnancy complicated by:  now h/o Cesarean x2 Delivery mode:  C-Section, Low Transverse  Anticipated Birth Control:  POPs   05/13/2021 Janet Berlin, MD

## 2021-05-11 NOTE — Op Note (Addendum)
Alexa Alvarez PROCEDURE DATE: 05/11/2021  PREOPERATIVE DIAGNOSIS: Intrauterine pregnancy at  [redacted]w[redacted]d weeks gestation; abruptio placenta and non-reassuring fetal status  POSTOPERATIVE DIAGNOSIS: The same  PROCEDURE:     Cesarean Section  SURGEON:  Dr. Mora Bellman  ASSISTANT:   INDICATIONS: Alexa Alvarez is a 30 y.o. G3P1011 at [redacted]w[redacted]d scheduled for cesarean section secondary to abruptio placenta and non-reassuring fetal status.  The risks of cesarean section discussed with the patient included but were not limited to: bleeding which may require transfusion or reoperation; infection which may require antibiotics; injury to bowel, bladder, ureters or other surrounding organs; injury to the fetus; need for additional procedures including hysterectomy in the event of a life-threatening hemorrhage; placental abnormalities wth subsequent pregnancies, incisional problems, thromboembolic phenomenon and other postoperative/anesthesia complications. The patient concurred with the proposed plan, giving informed written consent for the procedure.    FINDINGS:  Viable female infant in cephalic presentation with nuchal cord x 2.  Apgars 8 and 9.  Clear amniotic fluid.  Intact placenta, three vessel cord.  Normal uterus, fallopian tubes and ovaries bilaterally.  ANESTHESIA:    Spinal INTRAVENOUS FLUIDS:1100 ml ESTIMATED BLOOD LOSS: 744ml URINE OUTPUT:  400 ml SPECIMENS: Placenta sent to pathology COMPLICATIONS: None immediate  PROCEDURE IN DETAIL:  The patient received intravenous antibiotics and had sequential compression devices applied to her lower extremities while in the preoperative area.  She was then taken to the operating room where anesthesia was induced and was found to be adequate. A foley catheter was placed into her bladder and attached to Alexa Alvarez gravity. She was then placed in a dorsal supine position with a leftward tilt, and prepped and draped in a sterile manner. After  an adequate timeout was performed, a Pfannenstiel skin incision was made with scalpel and carried through to the underlying layer of fascia. The fascia was incised in the midline and this incision was extended bilaterally using the Mayo scissors. Kocher clamps were applied to the superior aspect of the fascial incision and the underlying rectus muscles were dissected off bluntly. A similar process was carried out on the inferior aspect of the facial incision. The rectus muscles were separated in the midline bluntly and the peritoneum was entered bluntly. The Alexis self-retaining retractor was introduced into the abdominal cavity. Attention was turned to the lower uterine segment where a bladder flap was created, and a transverse hysterotomy was made with a scalpel and extended bilaterally bluntly. The infant was successfully delivered. Delayed cord clamping was performed for 1 minute. The cord was clamped and cut and infant was handed over to awaiting neonatology team. Uterine massage was then administered and the placenta delivered intact with three-vessel cord. The uterus was cleared of clot and debris.  The hysterotomy was closed with 0 Vicryl in a running locked fashion, and an imbricating layer was also placed with a 0 Vicryl. Overall, excellent hemostasis was noted. The pelvis copiously irrigated and cleared of all clot and debris. Hemostasis was confirmed on all surfaces.  The peritoneum and the muscles were reapproximated using 0 vicryl interrupted stitches. The fascia was then closed using 0 Vicryl in a running fashion.  The subcutaneous layer was reapproximated with plain gut and the skin was closed in a subcuticular fashion using 3.0 Vicryl. The patient tolerated the procedure well. Sponge, lap, instrument and needle counts were correct x 2. She was taken to the recovery room in stable condition.    Alexa Ivan ConstantMD  05/11/2021 11:40 AM

## 2021-05-11 NOTE — Progress Notes (Signed)
In room for pt's assessment at 2130. BP 125/83, HR 93. In-house interpreter Sunday Spillers at bedside. Pt does report some dizziness lying in bed. Discussed with pt plan of care for the night including getting pt up on side of bed and standing at bedside momentarily with BP checks with each position change. Discussed with help of interpreter for pt to let RN know if she feels dizzy or nauseated with position changes. Encouraged pt to continue to drink fluids. Attempted to sit pt up on side of bed at 2208 and pt immediately reported that she felt dizzy and was assisted in getting back into bed. BP checked in high-fowlers position with BP 127/86 and HR 114. Dr. Sylvester Harder notified and current IV fluid orders reviewed. Verbal order for LR @ 125/hr received and to continue pitocin as ordered for 16 hours. Will continue to monitor.

## 2021-05-11 NOTE — Anesthesia Preprocedure Evaluation (Signed)
Anesthesia Evaluation  Patient identified by MRN, date of birth, ID band Patient awake    Reviewed: Allergy & Precautions, NPO status , Patient's Chart, lab work & pertinent test results  Airway Mallampati: II  TM Distance: >3 FB Neck ROM: Full    Dental  (+) Teeth Intact   Pulmonary asthma ,    Pulmonary exam normal        Cardiovascular negative cardio ROS   Rhythm:Regular Rate:Normal     Neuro/Psych negative neurological ROS  negative psych ROS   GI/Hepatic Neg liver ROS, GERD  ,  Endo/Other  negative endocrine ROS  Renal/GU Renal disease  negative genitourinary   Musculoskeletal negative musculoskeletal ROS (+)   Abdominal (+)  Abdomen: soft. Bowel sounds: normal.  Peds  Hematology negative hematology ROS (+)   Anesthesia Other Findings   Reproductive/Obstetrics (+) Pregnancy                             Anesthesia Physical Anesthesia Plan  ASA: 2  Anesthesia Plan: Spinal   Post-op Pain Management:    Induction: Intravenous  PONV Risk Score and Plan: 2 and Treatment may vary due to age or medical condition  Airway Management Planned: Simple Face Mask, Natural Airway and Nasal Cannula  Additional Equipment: None  Intra-op Plan:   Post-operative Plan:   Informed Consent: I have reviewed the patients History and Physical, chart, labs and discussed the procedure including the risks, benefits and alternatives for the proposed anesthesia with the patient or authorized representative who has indicated his/her understanding and acceptance.     Dental advisory given and Interpreter used for interveiw  Plan Discussed with: CRNA  Anesthesia Plan Comments: (Lab Results      Component                Value               Date                      WBC                      10.3                05/11/2021                HGB                      10.1 (L)            05/11/2021                 HCT                      30.6 (L)            05/11/2021                MCV                      85.7                05/11/2021                PLT                      197  05/11/2021           Lab Results      Component                Value               Date                      NA                       140                 12/29/2018                K                        4.2                 12/29/2018                CO2                      22                  12/29/2018                GLUCOSE                  74                  12/29/2018                BUN                      7                   12/29/2018                CREATININE               0.71                12/29/2018                CALCIUM                  10.0                12/29/2018                GFRNONAA                 118                 12/29/2018                GFRAA                    136                 12/29/2018          )        Anesthesia Quick Evaluation

## 2021-05-11 NOTE — Anesthesia Procedure Notes (Addendum)
Spinal  Patient location during procedure: OR Start time: 05/11/2021 10:42 AM End time: 05/11/2021 10:44 AM Staffing Performed: anesthesiologist  Anesthesiologist: Darral Dash, DO Preanesthetic Checklist Completed: patient identified, IV checked, site marked, risks and benefits discussed, surgical consent, monitors and equipment checked, pre-op evaluation and timeout performed Spinal Block Patient position: sitting Prep: DuraPrep Patient monitoring: heart rate, cardiac monitor, continuous pulse ox and blood pressure Approach: midline Location: L3-4 Injection technique: single-shot Needle Needle type: Pencan  Needle gauge: 24 G Needle length: 10 cm Assessment Sensory level: T4 Events: CSF return Additional Notes Patient identified. Risks/Benefits/Options discussed with patient including but not limited to bleeding, infection, nerve damage, paralysis, failed block, incomplete pain control, headache, blood pressure changes, nausea, vomiting, reactions to medications, itching and postpartum back pain. Confirmed with bedside nurse the patient's most recent platelet count. Confirmed with patient that they are not currently taking any anticoagulation, have any bleeding history or any family history of bleeding disorders. Patient expressed understanding and wished to proceed. All questions were answered. Sterile technique was used throughout the entire procedure. Please see nursing notes for vital signs. Warning signs of high block given to the patient including shortness of breath, tingling/numbness in hands, complete motor block, or any concerning symptoms with instructions to call for help. Patient was given instructions on fall risk and not to get out of bed. All questions and concerns addressed with instructions to call with any issues or inadequate analgesia.

## 2021-05-11 NOTE — Progress Notes (Signed)
Labor Progress Note Alexa Alvarez is a 30 y.o. G3P1011 at [redacted]w[redacted]d presented for IOL-PD, TOLAC. S: Doing well without complaints.  O:  BP (!) 149/80   Pulse 92   Temp 98.4 F (36.9 C) (Oral)   Resp 18   Ht 5\' 5"  (1.651 m)   Wt 90.1 kg   BMI 33.05 kg/m  EFM: baseline 140bpm/mod variability/+accels/no decels Toco: q1-3 min  CVE: Dilation: 1 Effacement (%): Thick Cervical Position: Anterior Station: -3 Presentation: Vertex Exam by:: Dr. Sylvester Harder   A&P: 30 y.o. [id number] [redacted]w[redacted]d presented for IOL-PD #IOL/TOLAC: Cervical exam unchanged from prior. Was going to attempt [facility] Catheter placement but patient started having brisk bright red bleeding with clots. Dr. Elgie Congo called to bedside. Bleeding subsided, approx 148mL EBL. Will defer [facility] Catheter placement at this time. Pitocin currently at 41mL/hr, continue for cervical ripening. #Pain: PRN #FWB: cat 1 #GBS negative #Language barrier: stratus interpreter used for entirety of visit  Arrie Senate, MD 5:04 AM

## 2021-05-11 NOTE — Transfer of Care (Signed)
Immediate Anesthesia Transfer of Care Note  Patient: Alexa Alvarez  Procedure(s) Performed: CESAREAN SECTION  Patient Location: PACU  Anesthesia Type:Spinal  Level of Consciousness: awake  Airway & Oxygen Therapy: Patient Spontanous Breathing  Post-op Assessment: Report given to RN  Post vital signs: Reviewed and stable  Last Vitals:  Vitals Value Taken Time  BP 111/75 05/11/21 1330  Temp 36.6 C 05/11/21 1315  Pulse 90 05/11/21 1338  Resp 17 05/11/21 1338  SpO2 99 % 05/11/21 1338  Vitals shown include unvalidated device data.  Last Pain:  Vitals:   05/11/21 1330  TempSrc:   PainSc: 0-No pain         Complications: No notable events documented.

## 2021-05-11 NOTE — Lactation Note (Signed)
This note was copied from a baby's chart. Lactation Consultation Note  Patient Name: Alexa Alvarez ELYHT'M Date: 05/11/2021 Reason for consult: Initial assessment;Term Age:29 hours Consult was done in Spanish: Initial visit to 69 hours old infant of a P2 mother. Infant is sleeping in basinet upon arrival.  Talked to mother about hand expression and demonstrated technique. Colostrum easily expressed and collected ~3 mL in a spoon. [place] spoonfed infant. No latch assistance at this time, mother states she will try later. Parents plan to supplement as needed. Discussed breastfeeding basics.    Plan: 1-Skin to skin 2-Aim for a deep, comfortable latch 3-Breastfeeding on demand or 8-12 times in 24h period. 4-Keep infant awake during breastfeeding session: massaging breast, infant's hand/shoulder/feet 5-Monitor voids and stools as signs good intake.  6-Encouraged maternal rest, hydration and food intake.  7-Contact LC as needed for feeds/support/concerns/questions   All questions answered at this time. Provided Lactation services brochure and promoted [employer] booklet information.   Maternal Data Has patient been taught Hand Expression?: Yes Does the patient have breastfeeding experience prior to this delivery?: Yes How long did the patient breastfeed?: 15 months first child ~9years ago  Feeding Mother's Current Feeding Choice: Breast Milk and Formula Interventions Interventions: Breast feeding basics reviewed;Skin to skin;Breast massage;Hand express;Hand pump;Expressed milk;Education  Discharge Pump: Manual WIC Program: Yes  Consult Status Consult Status: Follow-up Date: 05/12/21 Follow-up type: In-patient    Alexa Alvarez 05/11/2021, 3:29 PM

## 2021-05-12 ENCOUNTER — Encounter (HOSPITAL_COMMUNITY): Payer: Self-pay | Admitting: Obstetrics and Gynecology

## 2021-05-12 LAB — CBC
HCT: 20.2 % — ABNORMAL LOW (ref 36.0–46.0)
Hemoglobin: 6.7 g/dL — CL (ref 12.0–15.0)
MCH: 28.6 pg (ref 26.0–34.0)
MCHC: 33.2 g/dL (ref 30.0–36.0)
MCV: 86.3 fL (ref 80.0–100.0)
Platelets: 176 10*3/uL (ref 150–400)
RBC: 2.34 MIL/uL — ABNORMAL LOW (ref 3.87–5.11)
RDW: 14.6 % (ref 11.5–15.5)
WBC: 16.2 10*3/uL — ABNORMAL HIGH (ref 4.0–10.5)
nRBC: 0 % (ref 0.0–0.2)

## 2021-05-12 LAB — CREATININE, SERUM
Creatinine, Ser: 0.62 mg/dL (ref 0.44–1.00)
GFR, Estimated: >60 mL/min (ref >60)

## 2021-05-12 LAB — PREPARE RBC (CROSSMATCH)

## 2021-05-12 MED ORDER — ACETAMINOPHEN 500 MG PO TABS
1000.0000 mg | ORAL_TABLET | Freq: Four times a day (QID) | ORAL | Status: DC | PRN
  Administered 2021-05-12 (×3): 1000 mg via ORAL
  Filled 2021-05-12 (×3): qty 2

## 2021-05-12 MED ORDER — SODIUM CHLORIDE 0.9% IV SOLUTION: Freq: Once | INTRAVENOUS | Status: AC

## 2021-05-12 NOTE — Progress Notes (Addendum)
POSTPARTUM PROGRESS NOTE  Subjective: Alexa Alvarez is a 30 y.o. G3P2012 POD #1 from rLTCS at [redacted]w[redacted]d.  She reports she doing well. No acute events overnight. She denies any problems with voiding or po intake. Patient notes that she has not ambulated much overnight. Denies nausea or vomiting. She has passed flatus. Pain is well controlled.  Lochia is minimal.   Spanish interpreter was used throughout history and physical exam.   Objective: Blood pressure 118/72, pulse 72, temperature 98.6 F (37 C), temperature source Oral, resp. rate 20, height 5\' 5"  (1.651 m), weight 90.1 kg, SpO2 100 %, unknown if currently breastfeeding.  Physical Exam:  General: alert, cooperative and no distress Chest: no respiratory distress Abdomen: soft, non-tender  Uterine Fundus: firm and at level of umbilicus Extremities: No calf swelling or tenderness  no edema Dressing: honeycomb dressing intact.   Recent Labs    05/11/21 1241 05/12/21 0427  HGB 9.4* 6.7*  HCT 29.4* 20.2*    Assessment/Plan: Alexa Alvarez is a 30 y.o. [id number] POD#1 rLTCS at [redacted]w[redacted]d secondary to abruptio placenta and non-reassuring fetal status. In the OR she had several medium sized clots with fundal massage and was given TXA, methergine, and hemabate. CBC and DIC labs were within normal limits.   Routine Postpartum Care: Doing well, pain well-controlled.  -- Anemia: Hgb: trended down 11.1-->10.1-->9.4. She received oral iron. CBC at 0427 this morning was 6.7. She was started on 1 unit pRBC. Tolerating well. Will repeat CBC prior to discharge. -- Continue routine care, lactation support.  -- Contraception: POPs -- Feeding: breast and bottle feeding  Dispo: Plan for discharge tentative POD # 2-3.  Marcene Corning 05/12/2021 7:49 AM   CNM attestation Post Partum Day #1 I have seen and examined this patient and agree with above documentation in the PA student's note.   Alexa Alvarez is  a 30 y.o. [id number] s/p rLTCS for abruption/NRFHR (failed TOLAC).  Pt denies problems with po intake; foley still in place and she hasn't ambulated yet. Pain is well controlled.  Plan for birth control is oral progesterone-only contraceptive.  Method of Feeding: both Denies s/s anemia.  PE:  BP 118/72 (BP Location: Left Arm)   Pulse 72   Temp 98.6 F (37 C) (Oral)   Resp 20   Ht 5\' 5"  (1.651 m)   Wt 90.1 kg   SpO2 100%   Breastfeeding Unknown   BMI 33.05 kg/m  Fundus firm Abd incision: honeycomb intact and dry  Hgb 6.7 (was 9.4)  Plan for discharge:  -Consented for transfusion of 1u PRBC due to Hgb this morning -CBC repeat 6/14  Myrtis Ser, CNM 8:33 AM  05/12/2021

## 2021-05-12 NOTE — Anesthesia Postprocedure Evaluation (Signed)
Anesthesia Post Note  Patient: Alexa Alvarez  Procedure(s) Performed: [place]     Patient location during evaluation: Mother Baby Anesthesia Type: Spinal Level of consciousness: oriented and awake and alert Pain management: pain level controlled Vital Signs Assessment: post-procedure vital signs reviewed and stable Respiratory status: spontaneous breathing and respiratory function stable Cardiovascular status: blood pressure returned to baseline and stable Postop Assessment: no headache, no backache, no apparent nausea or vomiting and able to ambulate Anesthetic complications: no   No notable events documented.  Last Vitals:  Vitals:   05/11/21 2340 05/12/21 0115  BP:  119/67  Pulse: 98 88  Resp: 20 18  Temp:  36.7 C  SpO2: 98% 98%    Last Pain:  Vitals:   05/12/21 0141  TempSrc:   PainSc: 2                  March Rummage Sadiya Durand

## 2021-05-12 NOTE — Lactation Note (Signed)
This note was copied from a baby's chart. Lactation Consultation Note  Patient Name: Girl Niveah Boerner [id number] Date: 05/12/2021 Reason for consult: Follow-up assessment;Term Age:29 hours   P2 mother whose infant is now 36 hours old.  This is a term baby at 40+6 weeks.  Mother breast fed her first child (now 71 years old) for 1 1/2 years.  RN requested latch assistance.  Spanish interpreter, Constance Holster [phone]) used for interpretation.  Baby received short term oxygenation and was showing possible signs of TTN after birth per NP notes, and was sent to [facility] around 1600 yesterday for monitoring.  RN has been doing frequent VS monitoring and baby has been WNL.  Mother had baby clothed and was attempting to latch in the cradle position.  Offered to assist and mother interested.  Discussed STS and using either the cross cradle or football positions for latching.  Mother interested in the cross cradle. Taught hand expression and mother expressed 2 drops of colostrum which I finger fed back to baby. Attempted  to latch x 2 but baby was not interested.  Suggested the football position and baby latched.  With gentle stimulation she began sucking and I observed her for 10 minutes prior to exiting the room.  Baby was calm and RR WNL; color pink and no distress noted.  Basic breast feeding education completed.  Mother felt comfortable with finishing the feeding after 10 minutes.  She will call for assistance as needed.  Father present and asleep on the couch.  RN updated.   Maternal Data Has patient been taught Hand Expression?: Yes Does the patient have breastfeeding experience prior to this delivery?: Yes How long did the patient breastfeed?: 1 1/2 years  Feeding Mother's Current Feeding Choice: Breast Milk and Formula  LATCH Score Latch: Repeated attempts needed to sustain latch, nipple held in mouth throughout feeding, stimulation needed to elicit sucking reflex.  Audible  Swallowing: None  Type of Nipple: Everted at rest and after stimulation (short shafted)  Comfort (Breast/Nipple): Soft / non-tender  Hold (Positioning): Assistance needed to correctly position infant at breast and maintain latch.  LATCH Score: 6   Lactation Tools Discussed/Used    Interventions Interventions: Breast feeding basics reviewed;Assisted with latch;Skin to skin;Breast massage;Hand express;Breast compression;Adjust position;Position options;Support pillows;Education  Discharge    Consult Status Consult Status: Follow-up Date: 05/13/21 Follow-up type: In-patient    Kayshaun Polanco R Verdis Koval 05/12/2021, 6:14 AM

## 2021-05-12 NOTE — Progress Notes (Addendum)
In room answering questions regarding baby. Assistance provided by [employer] Spanish interpreter # [phone] Lennette Bihari.Pt requesting pain medication. Pt can have motrin at 0400, declines percocet. Dr. Sylvester Harder notified and prn tylenol order received.   Addendum: With use of interpreter, pt does report dizziness in bed even after initiation of LR but states it has gotten a little better.

## 2021-05-12 NOTE — Progress Notes (Signed)
Notifed Dr. Sylvester Harder of Hgb result of 6.7. Dr. Sylvester Harder to place order to transfuse 1 unit of PRBCs. VSS, pt denies c/o dizziness in bed. Pt slept after tylenol administration at [place] until 0515 and has not been able to get out of bed yet and still has foley. Per Dr. Sylvester Harder, may wait until after transfusion to get pt up and remove foley.

## 2021-05-13 LAB — CBC
HCT: 21.8 % — ABNORMAL LOW (ref 36.0–46.0)
HCT: 25.4 % — ABNORMAL LOW (ref 36.0–46.0)
Hemoglobin: 7.1 g/dL — ABNORMAL LOW (ref 12.0–15.0)
Hemoglobin: 8.2 g/dL — ABNORMAL LOW (ref 12.0–15.0)
MCH: 28.5 pg (ref 26.0–34.0)
MCH: 28.2 pg (ref 26.0–34.0)
MCHC: 32.6 g/dL (ref 30.0–36.0)
MCHC: 32.3 g/dL (ref 30.0–36.0)
MCV: 87.6 fL (ref 80.0–100.0)
MCV: 87.3 fL (ref 80.0–100.0)
Platelets: 177 10*3/uL (ref 150–400)
Platelets: 216 10*3/uL (ref 150–400)
RBC: 2.49 MIL/uL — ABNORMAL LOW (ref 3.87–5.11)
RBC: 2.91 MIL/uL — ABNORMAL LOW (ref 3.87–5.11)
RDW: 15.3 % (ref 11.5–15.5)
RDW: 15.4 % (ref 11.5–15.5)
WBC: 11.4 10*3/uL — ABNORMAL HIGH (ref 4.0–10.5)
WBC: 11.1 10*3/uL — ABNORMAL HIGH (ref 4.0–10.5)
nRBC: 0.4 % — ABNORMAL HIGH (ref 0.0–0.2)
nRBC: 0.3 % — ABNORMAL HIGH (ref 0.0–0.2)

## 2021-05-13 LAB — SURGICAL PATHOLOGY

## 2021-05-13 LAB — PREPARE RBC (CROSSMATCH)

## 2021-05-13 MED ORDER — FERROUS SULFATE 325 (65 FE) MG PO TABS: 325.0000 mg | ORAL_TABLET | ORAL | 0 refills | Status: DC

## 2021-05-13 MED ORDER — ACETAMINOPHEN 500 MG PO TABS: 1000.0000 mg | ORAL_TABLET | Freq: Four times a day (QID) | ORAL | 0 refills | Status: DC | PRN

## 2021-05-13 MED ORDER — NORETHINDRONE 0.35 MG PO TABS: 1.0000 | ORAL_TABLET | Freq: Every day | ORAL | 11 refills | Status: AC

## 2021-05-13 MED ORDER — SENNOSIDES-DOCUSATE SODIUM 8.6-50 MG PO TABS: 2.0000 | ORAL_TABLET | Freq: Every day | ORAL | 0 refills | Status: DC

## 2021-05-13 MED ORDER — IBUPROFEN 600 MG PO TABS: 600.0000 mg | ORAL_TABLET | Freq: Four times a day (QID) | ORAL | 0 refills | Status: DC

## 2021-05-13 MED ORDER — OXYCODONE HCL 5 MG PO TABS: 5.0000 mg | ORAL_TABLET | Freq: Three times a day (TID) | ORAL | 0 refills | Status: AC | PRN

## 2021-05-13 NOTE — Progress Notes (Addendum)
POSTPARTUM PROGRESS NOTE  Subjective: Alexa Alvarez is a 30 y.o. G3P2012 POD #2 from rLTCS at [redacted]w[redacted]d.  She reports she is doing well. No acute events overnight. She denies any problems with voiding or po intake. Ambulating without difficulty. Foley has been out.  Denies nausea or vomiting. She has passed flatus and had a BM. Pain is well controlled.  Lochia is minimal. She denies any anemia symptoms.  Objective: Blood pressure 124/81, pulse 77, temperature 97.9 F (36.6 C), resp. rate 16, height 5\' 5"  (1.651 m), weight 90.1 kg, SpO2 99 %, unknown if currently breastfeeding.  Physical Exam:  General: alert, cooperative and no distress Chest: no respiratory distress Abdomen: soft, non-tender  Uterine Fundus: firm and at level of umbilicus Extremities: No calf swelling or tenderness  no edema Dressing: honeycomb dressing clean, dry, intact.   Recent Labs    05/11/21 1241 05/12/21 0427  HGB 9.4* 6.7*  HCT 29.4* 20.2*     Assessment/Plan: Alexa Alvarez is a 30 y.o. [id number] POD#1 rLTCS at [redacted]w[redacted]d secondary to abruptio placenta and non-reassuring fetal status. In the OR she had several medium sized clots with fundal massage and was given TXA, methergine, and hemabate. CBC and DIC labs were within normal limits.   Routine Postpartum Care:  -- Post-operative anemia: S/p 1U pRBCs after Hgb of 6.7. Repeat Hgb this morning of 7.1. With minimal improvement in Hgb after a unit yesterday, will give another unit pRBCs. Will check a post-transfusion CBC. May consider discharge in the afternoon after CBC comes back if improving. Will need to be discharged on PO iron. -- Contraception: POPs -- Feeding: breast and bottle feeding  Dispo: Plan for discharge possibly in PM if CBC improving after repeat transfusion.  Alexa Hives, MD 05/13/2021 4:39 AM     Provider attestation I have seen and examined this patient and agree with above documentation in the resident's note.    Alexa Alvarez is a 30 y.o. [id number] s/p LTCS.  Pain is well controlled. Plan for birth control is oral progesterone-only contraceptive. Method of Feeding: breast & bottle  PE:  Gen: well appearing Heart: reg rate Lungs: normal WOB Fundus firm Ext: no pain, no edema Incision: dressing clean  Recent Labs    05/12/21 0427 05/13/21 0543  HGB 6.7* 7.1*  HCT 20.2* 21.8*    Assessment S/p LTCS PPD # 2  Plan: -Patient currently receiving 2nd unit of PRBC. Will recheck CBC later today & d/c home if doing ok -pt report no pain & is currently asymptomatic of anemia -Spanish interpreter, Alexa Alvarez, was at bedside for this encounter.    Alexa Guild, NP 9:39 AM

## 2021-05-13 NOTE — Progress Notes (Signed)
Ordered pt meals by Alexa Alvarez Spanish Medical Interpreter. 

## 2021-05-14 LAB — TYPE AND SCREEN
ABO/RH(D): AB POS
Antibody Screen: NEGATIVE
Unit division: 0
Unit division: 0

## 2021-05-14 LAB — BPAM RBC
Blood Product Expiration Date: 202207102359
Blood Product Expiration Date: 202207132359
ISSUE DATE / TIME: 202206130632
ISSUE DATE / TIME: 202206140911
Unit Type and Rh: 8400
Unit Type and Rh: 8400

## 2021-12-02 ENCOUNTER — Emergency Department (HOSPITAL_COMMUNITY)
Admission: EM | Admit: 2021-12-02 | Discharge: 2021-12-02 | Discharge status: Against Medical Advice | Payer: Self-pay | Attending: Physician Assistant | Admitting: Physician Assistant

## 2021-12-02 ENCOUNTER — Encounter (HOSPITAL_COMMUNITY): Payer: Self-pay | Admitting: Emergency Medicine

## 2021-12-02 ENCOUNTER — Other Ambulatory Visit: Payer: Self-pay

## 2021-12-02 ENCOUNTER — Emergency Department (HOSPITAL_COMMUNITY): Payer: Self-pay

## 2021-12-02 DIAGNOSIS — Z5321 Procedure and treatment not carried out due to patient leaving prior to being seen by health care provider: Secondary | ICD-10-CM | POA: Insufficient documentation

## 2021-12-02 DIAGNOSIS — R109 Unspecified abdominal pain: Secondary | ICD-10-CM | POA: Insufficient documentation

## 2021-12-02 LAB — CBC WITH DIFFERENTIAL/PLATELET
Abs Immature Granulocytes: 0.02 10*3/uL (ref 0.00–0.07)
Basophils Absolute: 0 10*3/uL (ref 0.0–0.1)
Basophils Relative: 0 %
Eosinophils Absolute: 0.1 10*3/uL (ref 0.0–0.5)
Eosinophils Relative: 1 %
HCT: 40 % (ref 36.0–46.0)
Hemoglobin: 12.7 g/dL (ref 12.0–15.0)
Immature Granulocytes: 0 %
Lymphocytes Relative: 24 %
Lymphs Abs: 2.1 10*3/uL (ref 0.7–4.0)
MCH: 29.3 pg (ref 26.0–34.0)
MCHC: 31.8 g/dL (ref 30.0–36.0)
MCV: 92.4 fL (ref 80.0–100.0)
Monocytes Absolute: 0.5 10*3/uL (ref 0.1–1.0)
Monocytes Relative: 6 %
Neutro Abs: 5.9 10*3/uL (ref 1.7–7.7)
Neutrophils Relative %: 69 %
Platelets: 302 10*3/uL (ref 150–400)
RBC: 4.33 MIL/uL (ref 3.87–5.11)
RDW: 13 % (ref 11.5–15.5)
WBC: 8.7 10*3/uL (ref 4.0–10.5)
nRBC: 0 % (ref 0.0–0.2)

## 2021-12-02 LAB — URINALYSIS, ROUTINE W REFLEX MICROSCOPIC
Bilirubin Urine: NEGATIVE
Glucose, UA: NEGATIVE mg/dL
Hgb urine dipstick: NEGATIVE
Ketones, ur: NEGATIVE mg/dL
Leukocytes,Ua: NEGATIVE
Nitrite: NEGATIVE
Protein, ur: NEGATIVE mg/dL
Specific Gravity, Urine: 1.025 (ref 1.005–1.030)
pH: 6 (ref 5.0–8.0)

## 2021-12-02 LAB — COMPREHENSIVE METABOLIC PANEL
ALT: 43 U/L (ref 0–44)
AST: 34 U/L (ref 15–41)
Albumin: 3.9 g/dL (ref 3.5–5.0)
Alkaline Phosphatase: 124 U/L (ref 38–126)
Anion gap: 7 (ref 5–15)
BUN: 9 mg/dL (ref 6–20)
CO2: 25 mmol/L (ref 22–32)
Calcium: 9.1 mg/dL (ref 8.9–10.3)
Chloride: 103 mmol/L (ref 98–111)
Creatinine, Ser: 0.6 mg/dL (ref 0.44–1.00)
GFR, Estimated: >60 mL/min (ref >60)
Glucose, Bld: 103 mg/dL — ABNORMAL HIGH (ref 70–99)
Potassium: 3.9 mmol/L (ref 3.5–5.1)
Sodium: 135 mmol/L (ref 135–145)
Total Bilirubin: 0.5 mg/dL (ref 0.3–1.2)
Total Protein: 7.4 g/dL (ref 6.5–8.1)

## 2021-12-02 LAB — LIPASE, BLOOD: Lipase: 33 U/L (ref 11–51)

## 2021-12-02 NOTE — ED Notes (Signed)
Patient called, no response.

## 2021-12-02 NOTE — ED Notes (Signed)
Called patient 2 more times, No Answer.

## 2021-12-02 NOTE — ED Notes (Signed)
Called for patient to get V/S; No answer.

## 2021-12-02 NOTE — ED Triage Notes (Signed)
Patient here with abdominal pain that goes up into her chest.  No shortness of breath.  Patient states that she woke up with the pain.  No nausea or vomiting or diarrhea.

## 2021-12-02 NOTE — ED Provider Notes (Signed)
Emergency Medicine Provider Triage Evaluation Note  Alexa Alvarez , a 31 y.o. female  was evaluated in triage.  Pt complains of abdominal pain and chest pain. She states that her pain started at 230 am and woke her up.  Pain is upper abdomen.  She denies nausea vomiting or diarrhea.  No cough fever this of breath.  The pain radiates up into both sides of her chest.  She denies a history of similar.  Review of Systems  Positive: See above Negative:   Physical Exam  BP 127/78    Pulse 84    Temp 98.4 F (36.9 C) (Oral)    Resp 18    LMP  (LMP Unknown)    SpO2 99%  Gen:   Awake, no distress   Resp:  Normal effort, lungs CTAB MSK:   Moves extremities without difficulty  Other:  Epigastric abdominal TTP.   Medical Decision Making  Medically screening exam initiated at 3:41 AM.  Appropriate orders placed.  [name] was informed that the remainder of the evaluation will be completed by another provider, this initial triage assessment does not replace that evaluation, and the importance of remaining in the ED until their evaluation is complete.  Note: Portions of this report may have been transcribed using voice recognition software. Every effort was made to ensure accuracy; however, inadvertent computerized transcription errors may be present    Lorin Glass, PA-C 12/02/21 4580    Merryl Hacker, MD 12/02/21 2302

## 2022-01-18 ENCOUNTER — Emergency Department (HOSPITAL_COMMUNITY): Payer: Self-pay

## 2022-01-18 ENCOUNTER — Emergency Department (HOSPITAL_COMMUNITY)
Admission: EM | Admit: 2022-01-18 | Discharge: 2022-01-19 | Disposition: A | Discharge status: Home / Self Care | Payer: Self-pay | Attending: Emergency Medicine | Admitting: Emergency Medicine

## 2022-01-18 ENCOUNTER — Other Ambulatory Visit: Payer: Self-pay

## 2022-01-18 DIAGNOSIS — R079 Chest pain, unspecified: Secondary | ICD-10-CM | POA: Insufficient documentation

## 2022-01-18 DIAGNOSIS — K219 Gastro-esophageal reflux disease without esophagitis: Secondary | ICD-10-CM | POA: Insufficient documentation

## 2022-01-18 DIAGNOSIS — Z79899 Other long term (current) drug therapy: Secondary | ICD-10-CM | POA: Insufficient documentation

## 2022-01-18 DIAGNOSIS — R7401 Elevation of levels of liver transaminase levels: Secondary | ICD-10-CM | POA: Insufficient documentation

## 2022-01-18 DIAGNOSIS — R0602 Shortness of breath: Secondary | ICD-10-CM | POA: Insufficient documentation

## 2022-01-18 DIAGNOSIS — R110 Nausea: Secondary | ICD-10-CM | POA: Insufficient documentation

## 2022-01-18 LAB — COMPREHENSIVE METABOLIC PANEL
ALT: 60 U/L — ABNORMAL HIGH (ref 0–44)
AST: 60 U/L — ABNORMAL HIGH (ref 15–41)
Albumin: 4.2 g/dL (ref 3.5–5.0)
Alkaline Phosphatase: 117 U/L (ref 38–126)
Anion gap: 13 (ref 5–15)
BUN: 8 mg/dL (ref 6–20)
CO2: 21 mmol/L — ABNORMAL LOW (ref 22–32)
Calcium: 9.4 mg/dL (ref 8.9–10.3)
Chloride: 105 mmol/L (ref 98–111)
Creatinine, Ser: 0.59 mg/dL (ref 0.44–1.00)
GFR, Estimated: >60 mL/min (ref >60)
Glucose, Bld: 113 mg/dL — ABNORMAL HIGH (ref 70–99)
Potassium: 3.4 mmol/L — ABNORMAL LOW (ref 3.5–5.1)
Sodium: 139 mmol/L (ref 135–145)
Total Bilirubin: 0.3 mg/dL (ref 0.3–1.2)
Total Protein: 7.7 g/dL (ref 6.5–8.1)

## 2022-01-18 LAB — CBC WITH DIFFERENTIAL/PLATELET
Abs Immature Granulocytes: 0.02 10*3/uL (ref 0.00–0.07)
Basophils Absolute: 0 10*3/uL (ref 0.0–0.1)
Basophils Relative: 0 %
Eosinophils Absolute: 0.1 10*3/uL (ref 0.0–0.5)
Eosinophils Relative: 1 %
HCT: 42.4 % (ref 36.0–46.0)
Hemoglobin: 13.6 g/dL (ref 12.0–15.0)
Immature Granulocytes: 0 %
Lymphocytes Relative: 26 %
Lymphs Abs: 1.9 10*3/uL (ref 0.7–4.0)
MCH: 30.2 pg (ref 26.0–34.0)
MCHC: 32.1 g/dL (ref 30.0–36.0)
MCV: 94 fL (ref 80.0–100.0)
Monocytes Absolute: 0.5 10*3/uL (ref 0.1–1.0)
Monocytes Relative: 7 %
Neutro Abs: 4.9 10*3/uL (ref 1.7–7.7)
Neutrophils Relative %: 66 %
Platelets: 288 10*3/uL (ref 150–400)
RBC: 4.51 MIL/uL (ref 3.87–5.11)
RDW: 12.1 % (ref 11.5–15.5)
WBC: 7.4 10*3/uL (ref 4.0–10.5)
nRBC: 0 % (ref 0.0–0.2)

## 2022-01-18 LAB — URINALYSIS, ROUTINE W REFLEX MICROSCOPIC
Bilirubin Urine: NEGATIVE
Glucose, UA: NEGATIVE mg/dL
Hgb urine dipstick: NEGATIVE
Ketones, ur: NEGATIVE mg/dL
Leukocytes,Ua: NEGATIVE
Nitrite: NEGATIVE
Protein, ur: NEGATIVE mg/dL
Specific Gravity, Urine: 1.015 (ref 1.005–1.030)
pH: 7 (ref 5.0–8.0)

## 2022-01-18 LAB — HCG, SERUM, QUALITATIVE: Preg, Serum: NEGATIVE

## 2022-01-18 LAB — LIPASE, BLOOD: Lipase: 44 U/L (ref 11–51)

## 2022-01-18 LAB — TROPONIN I (HIGH SENSITIVITY): Troponin I (High Sensitivity): 3 ng/L (ref <18)

## 2022-01-18 MED ORDER — LIDOCAINE VISCOUS HCL 2 % MT SOLN
15.0000 mL | Freq: Once | OROMUCOSAL | Status: AC
Start: 2022-01-18 — End: 2022-01-18
  Administered 2022-01-18: 15 mL via ORAL
  Filled 2022-01-18: qty 15

## 2022-01-18 MED ORDER — ALUM & MAG HYDROXIDE-SIMETH 200-200-20 MG/5ML PO SUSP
30.0000 mL | Freq: Once | ORAL | Status: AC
  Administered 2022-01-18: 30 mL via ORAL
  Filled 2022-01-18: qty 30

## 2022-01-18 NOTE — ED Provider Notes (Signed)
[facility] EMERGENCY DEPARTMENT Provider Note   CSN: [id number] Arrival date & time: 01/18/22  1959     History Chief Complaint  Patient presents with   Abdominal Pain   Chest Pain    Alexa Alvarez is a 31 y.o. female presents emergency department for evaluation of upper abdominal pain radiating to her diffuse chest episodically.  The patient reports that today around 1430 she had a short episode of this upper abdominal pain that radiated into her diffuse chest that was burning in nature.  She reports she had another episode around 1800 today that lasted between 15 to 20 minutes.  She reports that usually do not last as long and that is what made her concerned.  She reports she has some shortness of breath due to burning pain but is not experiencing any now.  She reports some of the pain is referred to her left shoulder.  She reports that when the pain comes on she feels like she wants to vomit but does not.  She does not feel nauseous now.  She denies any vaginal discharge, vaginal bleeding, fevers, dysuria, hematuria, diarrhea, constipation, dark or tarry stools, or URI symptoms.  She has not tried any medications prior to arrival.  Patient reports she had a similar episode to this in January 2 but left before being seen.  On her epic chart she has a history of GERD.  Reported C-section for surgical history.  Daily medications include OCPs.  No known drug allergies.  Denies any tobacco, EtOH, illicit drug use.   Abdominal Pain Associated symptoms: chest pain and nausea   Associated symptoms: no chills, no constipation, no diarrhea, no dysuria, no fever, no hematuria, no vaginal bleeding, no vaginal discharge and no vomiting   Chest Pain Associated symptoms: abdominal pain and nausea   Associated symptoms: no fever, no palpitations and no vomiting       Home Medications Prior to Admission medications   Medication Sig Start Date End Date Taking? Authorizing  Provider  acetaminophen (TYLENOL) 500 MG tablet Take 2 tablets (1,000 mg total) by mouth every 6 (six) hours as needed for mild pain. 05/13/21   Janet Berlin, MD  ferrous sulfate 325 (65 FE) MG tablet Take 1 tablet (325 mg total) by mouth every other day. 05/13/21   Janet Berlin, MD  ibuprofen (ADVIL) 600 MG tablet Take 1 tablet (600 mg total) by mouth every 6 (six) hours. 05/13/21   Janet Berlin, MD  norethindrone (ORTHO MICRONOR) 0.35 MG tablet Take 1 tablet (0.35 mg total) by mouth daily. 05/13/21 05/13/22  Janet Berlin, MD  oxyCODONE (ROXICODONE) 5 MG immediate release tablet Take 1 tablet (5 mg total) by mouth every 8 (eight) hours as needed. 05/13/21 05/13/22  Janet Berlin, MD  prenatal vitamin w/FE, FA (PRENATAL 1 + 1) 27-1 MG TABS tablet Take 1 tablet by mouth daily at 12 noon.    [provider]  senna-docusate (SENOKOT-S) 8.6-50 MG tablet Take 2 tablets by mouth daily. 05/13/21   Janet Berlin, MD      Allergies    Patient has no known allergies.    Review of Systems   Review of Systems  Constitutional:  Negative for chills and fever.  Cardiovascular:  Positive for chest pain. Negative for palpitations.  Gastrointestinal:  Positive for abdominal pain and nausea. Negative for constipation, diarrhea and vomiting.  Genitourinary:  Negative for dysuria, hematuria, vaginal bleeding and vaginal discharge.  SEE HPI  Physical Exam Updated Vital Signs BP 112/74    Pulse 73    Temp 98.2 F (36.8 C) (Oral)    Resp 15    Ht 5' 4"  (1.626 m)    Wt 77.1 kg    SpO2 99%    BMI 29.18 kg/m  Physical Exam Vitals and nursing note reviewed.  Constitutional:      General: She is not in acute distress.    Appearance: Normal appearance. She is not ill-appearing or toxic-appearing.  HENT:     Head: Normocephalic and atraumatic.  Eyes:     General: No scleral icterus. Cardiovascular:     Rate and Rhythm: Normal rate and regular rhythm.  Pulmonary:     Effort: Pulmonary  effort is normal. No respiratory distress.     Breath sounds: Normal breath sounds. No wheezing.     Comments: Clear to auscultation bilaterally.  No respiratory distress, accessory muscle use, tripoding, nasal flaring, or cyanosis present.  Patient is speaking in full sentences with ease.  Patient satting 99% on room air with no increased work of breathing.  Mild chest tenderness palpation. Chest:     Chest wall: Tenderness present.  Abdominal:     General: Abdomen is flat. Bowel sounds are normal. There is no distension.     Palpations: Abdomen is soft. There is no hepatomegaly.     Tenderness: There is no abdominal tenderness. There is no right CVA tenderness, left CVA tenderness, guarding or rebound.     Hernia: No hernia is present.     Comments: No abdominal tenderness palpation. No HSM. Normoactive bowel sounds. No overlying skin changes, other than faint C-section scar in lower abdomen.   Musculoskeletal:        General: No deformity.     Cervical back: Normal range of motion.  Skin:    General: Skin is warm and dry.  Neurological:     General: No focal deficit present.     Mental Status: She is alert. Mental status is at baseline.    ED Results / Procedures / Treatments   Labs (all labs ordered are listed, but only abnormal results are displayed) Labs Reviewed  COMPREHENSIVE METABOLIC PANEL - Abnormal; Notable for the following components:      Result Value   Potassium 3.4 (*)    CO2 21 (*)    Glucose, Bld 113 (*)    AST 60 (*)    ALT 60 (*)    All other components within normal limits  CBC WITH DIFFERENTIAL/PLATELET  LIPASE, BLOOD  HCG, SERUM, QUALITATIVE  URINALYSIS, ROUTINE W REFLEX MICROSCOPIC  TROPONIN I (HIGH SENSITIVITY)  TROPONIN I (HIGH SENSITIVITY)    EKG EKG Interpretation  Date/Time:  Sunday January 18 2022 20:20:51 EST Ventricular Rate:  67 PR Interval:  169 QRS Duration: 95 QT Interval:  415 QTC Calculation: 439 R Axis:   50 Text  Interpretation: Sinus rhythm Confirmed by Octaviano Glow [phone]) on 01/18/2022 10:34:05 PM  Radiology DG Chest 2 View  Result Date: 01/18/2022 CLINICAL DATA:  Shortness of breath and chest pain EXAM: CHEST - 2 VIEW COMPARISON:  12/02/2021 FINDINGS: Artifact overlies the chest. Patient has taken a poor inspiration. Question of central bronchial thickening. No infiltrate, collapse or effusion. IMPRESSION: Poor inspiration.  Possible bronchitis. Electronically Signed   By: Nelson Chimes M.D.   On: 01/18/2022 21:52    Procedures Procedures   Medications Ordered in ED Medications  alum & mag hydroxide-simeth (MAALOX/MYLANTA) 200-200-20 MG/5ML  suspension 30 mL (30 mLs Oral Given 01/18/22 2203)    And  lidocaine (XYLOCAINE) 2 % viscous mouth solution 15 mL (15 mLs Oral Given 01/18/22 2203)    ED Course/ Medical Decision Making/ A&P                           Medical Decision Making Amount and/or Complexity of Data Reviewed Labs: ordered. Radiology: ordered.  Risk OTC drugs. Prescription drug management.   31 year old female presents emergency department for evaluation of right upper quadrant upper quadrant abdominal pain radiating to her chest.  Differential diagnosis includes was not limited to ACS, PE, pneumonia, pneumothorax, GERD, PUD, gastritis, cholecystitis, pancreatitis.  Vital signs are stable.  Patient normotensive, afebrile, normal pulse rate.  Satting well on room air.  Physical exam is unremarkable other than some mild tenderness palpation over her diffuse chest wall.  The patient reports she has left shoulder tenderness despite my palpation and is not worsened by palpation.  She is in no acute distress and is very well-appearing.  Her abdomen is soft and nontender without any guarding or rebound.  Normal active bowel sounds.  No palpable masses or hepatosplenomegaly.  I independently reviewed and interpreted the patient's labs and imaging and agree with radiologist interpretation.   CBC does not show any signs leukocytosis or anemia.  CMP shows mildly decreased potassium 3.4.  Mildly decreased bicarb at 21.  Elevated glucose at 113.  Elevated AST and ALT with normal alk phos.  Likely fatty liver.  hCG negative initial troponin 3.  Lipase 44.  Urinalysis unremarkable.  Chest x-ray shows poor inspiration and possible bronchitis.  The patient has clear to auscultation lung sounds and I do not suspect bronchitis in his situation.  EKG shows normal sinus rhythm.  At this time, low suspicion of any ACS given the normal sinus rhythm on EKG and initial opponent of 3.  Following the hear pathways, can discharge patient.  Low suspicion for any pneumonia or pneumothorax the patient does not have any typical symptoms other than the episodic shortness of breath only during pain episodes and her chest x-ray did not show any indication.  I have low suspicion for any PE as this does not fit the typical presentation.  Low suspicion for any peptic ulcer disease given the lack of dark and tarry stools.  Low suspicion for cholecystitis given that the pain is rating to the left shoulder and side of the right.  Normal lipase, so low suspicion for any pancreatitis.  The patient was given a GI cocktail.  On reevaluation, the patient reports she is feeling much better after the GI cocktail.  She reports she would like to go home. I think this is reasonable given her improvement of pain and reassuring labs.   I discussed the patient's lab findings with her.  Discussed that this is likely GERD and we will send her home on omeprazole.  Strict return precautions were discussed with her such as dark or tarry stools, increased pain, coffee-ground emesis, hematemesis, worsening abdominal pain, chest pain, or shortness of breath.  Patient reports that she has an upcoming appointment to see a primary care provider.  She agrees to plan.  Patient is stable and being discharged home in good condition.  Translator line  used to explain her lab findings and discuss discharge instructions.   I discussed this case with my attending physician who cosigned this note including patient's presenting symptoms, physical exam,  and planned diagnostics and interventions. Attending physician stated agreement with plan or made changes to plan which were implemented.   Final Clinical Impression(s) / ED Diagnoses Final diagnoses:  Gastroesophageal reflux disease without esophagitis    Rx / DC Orders ED Discharge Orders          Ordered    omeprazole (PRILOSEC) 20 MG capsule  Daily,   Status:  Discontinued        01/19/22 0008              Sherrell Puller, PA-C 01/21/22 1624    Wyvonnia Dusky, MD 01/22/22 [phone]

## 2022-01-18 NOTE — Discharge Instructions (Addendum)
Were seen here today for evaluation of your chest pain and abdominal pain.  Your labs were normal and this sounds like reflux or GERD.  I have included more information on GERD and food choices for this to this discharge paperwork.  Additionally, I given you prescription for omeprazole for you to take every day for the next 30 days.  Please follow-up with your PCP for reevaluation of your symptoms.  I have attached a clinic to this discharge paperwork that you would need to call to schedule an appointment with.  If you have any worsening abdominal pain, shortness of breath, chest pain, dark or tarry stools, blood in your feces, or lightheadedness, please return to the nearest emergency department for reevaluation.  --------------------------------------  Fueron vistos aqu hoy para la evaluacin de su dolor de pecho y dolor abdominal. Sus laboratorios fueron normales y esto suena como reflujo o [place]. He incluido ms informacin sobre GERD y opciones de alimentos para esto en este papeleo de alta. Adems, le di una receta de omeprazol para que lo tome [employer] prximos 30 das. Haga un seguimiento con su PCP para una reevaluacin de sus sntomas. He adjuntado una clnica a esta documentacin de alta a la que deber llamar para programar una cita. Si tiene Clinical cytogeneticist abdominal que empeora, dificultad para respirar, [employer], heces oscuras o alquitranadas, sangre en las heces o mareos, regrese al departamento de emergencias ms cercano para una reevaluacin.

## 2022-01-18 NOTE — ED Notes (Signed)
Pt taken to xray 

## 2022-01-18 NOTE — ED Triage Notes (Signed)
Pt BIB EMS from home for burning abdominal pain radiating to her chest for 1 hours today. Had a similar episode 1 week ago. Pt also complains of a cough for 2-3 Weeks which has caused chest soreness.   128/92 72 NSR  16 RR 99% RA

## 2022-01-19 MED ORDER — OMEPRAZOLE 20 MG PO CPDR: 20.0000 mg | DELAYED_RELEASE_CAPSULE | Freq: Every day | ORAL | 0 refills | Status: DC

## 2022-01-21 ENCOUNTER — Ambulatory Visit: Payer: Self-pay | Attending: Nurse Practitioner | Admitting: Nurse Practitioner

## 2022-01-21 ENCOUNTER — Encounter: Payer: Self-pay | Admitting: Nurse Practitioner

## 2022-01-21 VITALS — BP 111/77 | HR 83 | Resp 18 | Ht 64.0 in | Wt 176.0 lb

## 2022-01-21 DIAGNOSIS — K219 Gastro-esophageal reflux disease without esophagitis: Secondary | ICD-10-CM

## 2022-01-21 DIAGNOSIS — Z23 Encounter for immunization: Secondary | ICD-10-CM

## 2022-01-21 DIAGNOSIS — Z7689 Persons encountering health services in other specified circumstances: Secondary | ICD-10-CM

## 2022-01-21 MED ORDER — OMEPRAZOLE 20 MG PO CPDR: 20.0000 mg | DELAYED_RELEASE_CAPSULE | Freq: Every day | ORAL | 1 refills | Status: DC

## 2022-01-21 NOTE — Progress Notes (Signed)
Assessment & Plan:  Alexa Alvarez was seen today for hospitalization follow-up and establish care.  Diagnoses and all orders for this visit:  Encounter to establish care  Gastroesophageal reflux disease without esophagitis -     omeprazole (PRILOSEC) 20 MG capsule; Take 1 capsule (20 mg total) by mouth daily. INSTRUCTIONS: Avoid GERD Triggers: acidic, spicy or fried foods, caffeine, coffee, sodas,  alcohol and chocolate.    Need for influenza vaccination -     Flu Vaccine QUAD 54mo+IM (Fluarix, Fluzone & Alfiuria Quad PF)    Patient has been counseled on age-appropriate routine health concerns for screening and prevention. These are reviewed and up-to-date. Referrals have been placed accordingly. Immunizations are up-to-date or declined.    Subjective:   Chief Complaint  Patient presents with   Hospitalization Follow-up   Establish Care   HPI Alexa Alvarez 31 y.o. female presents to office today to establish care.  She is accompanied by an onsite interpreter today for translation.  Past medical history significant for GERD for which she is taking omeprazole at this time.  She is only taking as needed.  We discussed dietary modifications today in detail with the importance of a food diary.  Review of Systems  Constitutional:  Negative for fever, malaise/fatigue and weight loss.  HENT: Negative.  Negative for nosebleeds.   Eyes: Negative.  Negative for blurred vision, double vision and photophobia.  Respiratory: Negative.  Negative for cough and shortness of breath.   Cardiovascular: Negative.  Negative for chest pain, palpitations and leg swelling.  Gastrointestinal:  Positive for heartburn. Negative for nausea and vomiting.  Musculoskeletal: Negative.  Negative for myalgias.  Neurological: Negative.  Negative for dizziness, focal weakness, seizures and headaches.  Psychiatric/Behavioral: Negative.  Negative for suicidal ideas.    Past Medical History:  Diagnosis  Date   Asthma    as a child-no current problems   Chronic kidney disease    kidney stone history   GERD (gastroesophageal reflux disease)     Past Surgical History:  Procedure Laterality Date   CESAREAN SECTION     CESAREAN SECTION N/A 05/11/2021   Procedure: CESAREAN SECTION;  Surgeon: Mora Bellman, MD;  Location: [facility];  Service: Obstetrics;  Laterality: N/A;  Non-reassuring fetal Heart tracing in the setting of susupected placental abruption   FINGER SURGERY Left    index finger-abcess-nail removed    Family History  Problem Relation Age of Onset   Diabetes Mother    Hypertension Mother    Breast cancer Mother     Social History Reviewed with no changes to be made today.   Outpatient Medications Prior to Visit  Medication Sig Dispense Refill   ferrous sulfate 325 (65 FE) MG tablet Take 1 tablet (325 mg total) by mouth every other day. 30 tablet 0   norethindrone (ORTHO MICRONOR) 0.35 MG tablet Take 1 tablet (0.35 mg total) by mouth daily. 28 tablet 11   oxyCODONE (ROXICODONE) 5 MG immediate release tablet Take 1 tablet (5 mg total) by mouth every 8 (eight) hours as needed. 20 tablet 0   omeprazole (PRILOSEC) 20 MG capsule Take 1 capsule (20 mg total) by mouth daily. 30 capsule 0   acetaminophen (TYLENOL) 500 MG tablet Take 2 tablets (1,000 mg total) by mouth every 6 (six) hours as needed for mild pain. (Patient not taking: Reported on 01/21/2022)  0   ibuprofen (ADVIL) 600 MG tablet Take 1 tablet (600 mg total) by mouth every 6 (six) hours. (Patient  not taking: Reported on 01/18/2022) 30 tablet 0   senna-docusate (SENOKOT-S) 8.6-50 MG tablet Take 2 tablets by mouth daily. (Patient not taking: Reported on 01/18/2022) 20 tablet 0   No facility-administered medications prior to visit.    No Known Allergies     Objective:    BP 111/77    Pulse 83    Resp 18    Ht 5\' 4"  (1.626 m)    Wt 176 lb (79.8 kg)    LMP 01/20/2022 (Exact Date)    SpO2 99%    Breastfeeding Yes    BMI  30.21 kg/m  Wt Readings from Last 3 Encounters:  01/21/22 176 lb (79.8 kg)  01/18/22 170 lb (77.1 kg)  05/11/21 198 lb 9.6 oz (90.1 kg)    Physical Exam Vitals and nursing note reviewed.  Constitutional:      Appearance: She is well-developed.  HENT:     Head: Normocephalic and atraumatic.  Cardiovascular:     Rate and Rhythm: Normal rate and regular rhythm.     Heart sounds: Normal heart sounds. No murmur heard.   No friction rub. No gallop.  Pulmonary:     Effort: Pulmonary effort is normal. No tachypnea or respiratory distress.     Breath sounds: Normal breath sounds. No decreased breath sounds, wheezing, rhonchi or rales.  Chest:     Chest wall: No tenderness.  Abdominal:     General: Bowel sounds are normal.     Palpations: Abdomen is soft.  Musculoskeletal:        General: Normal range of motion.     Cervical back: Normal range of motion.  Skin:    General: Skin is warm and dry.  Neurological:     Mental Status: She is alert and oriented to person, place, and time.     Coordination: Coordination normal.  Psychiatric:        Behavior: Behavior normal. Behavior is cooperative.        Thought Content: Thought content normal.        Judgment: Judgment normal.         Patient has been counseled extensively about nutrition and exercise as well as the importance of adherence with medications and regular follow-up. The patient was given clear instructions to go to ER or return to medical center if symptoms don't improve, worsen or new problems develop. The patient verbalized understanding.   Follow-up: Return for PAP SMEAR/CMP.   Gildardo Pounds, FNP-BC [facility] [place], [place]   01/21/2022, 10:18 AM

## 2022-03-16 ENCOUNTER — Ambulatory Visit: Payer: Self-pay | Admitting: Nurse Practitioner

## 2023-01-28 ENCOUNTER — Telehealth: Payer: Self-pay

## 2023-01-28 NOTE — Telephone Encounter (Signed)
Copied from [place] [id number]. Topic: General - Other >> Jan 28, 2023 12:24 PM Ja-Kwan M wrote: Reason for CRM: Pt stated she requested information for the orange card but has yet to receive anything. Pt requests call back to advise.

## 2023-02-02 ENCOUNTER — Encounter: Payer: Self-pay | Admitting: Nurse Practitioner

## 2023-02-02 ENCOUNTER — Ambulatory Visit: Payer: Self-pay | Attending: Nurse Practitioner | Admitting: Nurse Practitioner

## 2023-02-02 VITALS — BP 107/72 | HR 75 | Ht 64.0 in | Wt 176.4 lb

## 2023-02-02 DIAGNOSIS — R7309 Other abnormal glucose: Secondary | ICD-10-CM

## 2023-02-02 DIAGNOSIS — D179 Benign lipomatous neoplasm, unspecified: Secondary | ICD-10-CM

## 2023-02-02 DIAGNOSIS — Z862 Personal history of diseases of the blood and blood-forming organs and certain disorders involving the immune mechanism: Secondary | ICD-10-CM

## 2023-02-02 NOTE — Progress Notes (Signed)
Assessment & Plan:  Diagnoses and all orders for this visit:  Multiple lipomas Benign  History of anemia -     CBC with Differential  Elevated glucose -     CMP14+EGFR -     Hemoglobin A1c    Patient has been counseled on age-appropriate routine health concerns for screening and prevention. These are reviewed and up-to-date. Referrals have been placed accordingly. Immunizations are up-to-date or declined.    Subjective:   Chief Complaint  Patient presents with   Skin Problem   HPI Alexa Alvarez 32 y.o. female presents to office today with concerns of small lumps that she can feel in the right forearm and left posterior upper arm. She denies pain or tenderness.   On exam today she has a few very small lipomas in the areas of concern. There are no visible signs of skin infection or dermatitis. No further work up would be indicated at this time and she verbalized understanding.    Review of Systems  Constitutional:  Negative for fever, malaise/fatigue and weight loss.  HENT: Negative.  Negative for nosebleeds.   Eyes: Negative.  Negative for blurred vision, double vision and photophobia.  Respiratory: Negative.  Negative for cough and shortness of breath.   Cardiovascular: Negative.  Negative for chest pain, palpitations and leg swelling.  Gastrointestinal: Negative.  Negative for heartburn, nausea and vomiting.  Musculoskeletal: Negative.  Negative for myalgias.  Skin:        SEE HPI  Neurological: Negative.  Negative for dizziness, focal weakness, seizures and headaches.  Psychiatric/Behavioral: Negative.  Negative for suicidal ideas.     Past Medical History:  Diagnosis Date   Asthma    as a child-no current problems   Chronic kidney disease    kidney stone history   GERD (gastroesophageal reflux disease)     Past Surgical History:  Procedure Laterality Date   CESAREAN SECTION     CESAREAN SECTION N/A 05/11/2021   Procedure: CESAREAN SECTION;   Surgeon: Mora Bellman, MD;  Location: [facility];  Service: Obstetrics;  Laterality: N/A;  Non-reassuring fetal Heart tracing in the setting of susupected placental abruption   FINGER SURGERY Left    index finger-abcess-nail removed    Family History  Problem Relation Age of Onset   Diabetes Mother    Hypertension Mother    Breast cancer Mother     Social History Reviewed with no changes to be made today.   Outpatient Medications Prior to Visit  Medication Sig Dispense Refill   ferrous sulfate 325 (65 FE) MG tablet Take 1 tablet (325 mg total) by mouth every other day. 30 tablet 0   norethindrone (ORTHO MICRONOR) 0.35 MG tablet Take 1 tablet (0.35 mg total) by mouth daily. 28 tablet 11   omeprazole (PRILOSEC) 20 MG capsule Take 1 capsule (20 mg total) by mouth daily. 30 capsule 1   No facility-administered medications prior to visit.    No Known Allergies     Objective:    BP 107/72   Pulse 75   Ht '5\' 4"'$  (1.626 m)   Wt 176 lb 6.4 oz (80 kg)   LMP 02/01/2023 (Exact Date)   SpO2 99%   BMI 30.28 kg/m  Wt Readings from Last 3 Encounters:  02/02/23 176 lb 6.4 oz (80 kg)  01/21/22 176 lb (79.8 kg)  01/18/22 170 lb (77.1 kg)    Physical Exam Vitals and nursing note reviewed.  Constitutional:      Appearance:  She is well-developed.  HENT:     Head: Normocephalic and atraumatic.  Cardiovascular:     Rate and Rhythm: Normal rate and regular rhythm.     Heart sounds: Normal heart sounds. No murmur heard.    No friction rub. No gallop.  Pulmonary:     Effort: Pulmonary effort is normal. No tachypnea or respiratory distress.     Breath sounds: Normal breath sounds. No decreased breath sounds, wheezing, rhonchi or rales.  Chest:     Chest wall: No tenderness.  Abdominal:     General: Bowel sounds are normal.     Palpations: Abdomen is soft.  Musculoskeletal:        General: Normal range of motion.     Cervical back: Normal range of motion.  Skin:    General: Skin  is warm and dry.       Neurological:     Mental Status: She is alert and oriented to person, place, and time.     Coordination: Coordination normal.  Psychiatric:        Behavior: Behavior normal. Behavior is cooperative.        Thought Content: Thought content normal.        Judgment: Judgment normal.          Patient has been counseled extensively about nutrition and exercise as well as the importance of adherence with medications and regular follow-up. The patient was given clear instructions to go to ER or return to medical center if symptoms don't improve, worsen or new problems develop. The patient verbalized understanding.   Follow-up: Return if symptoms worsen or fail to improve.   Gildardo Pounds, FNP-BC [facility] and [place], [place]   02/02/2023, 4:04 PM

## 2023-02-02 NOTE — Progress Notes (Signed)
Patient has lumps under skin on both hands.

## 2023-02-03 LAB — HEMOGLOBIN A1C
Est. average glucose Bld gHb Est-mCnc: 114 mg/dL
Hgb A1c MFr Bld: 5.6 % (ref 4.8–5.6)

## 2023-02-03 LAB — CMP14+EGFR
ALT: 33 IU/L — ABNORMAL HIGH (ref 0–32)
AST: 18 IU/L (ref 0–40)
Albumin/Globulin Ratio: 1.4 (ref 1.2–2.2)
Albumin: 4.6 g/dL (ref 4.0–5.0)
Alkaline Phosphatase: 123 IU/L — ABNORMAL HIGH (ref 44–121)
BUN/Creatinine Ratio: 21 (ref 9–23)
BUN: 13 mg/dL (ref 6–20)
Bilirubin Total: 0.5 mg/dL (ref 0.0–1.2)
CO2: 22 mmol/L (ref 20–29)
Calcium: 10 mg/dL (ref 8.7–10.2)
Chloride: 100 mmol/L (ref 96–106)
Creatinine, Ser: 0.63 mg/dL (ref 0.57–1.00)
Globulin, Total: 3.2 g/dL (ref 1.5–4.5)
Glucose: 82 mg/dL (ref 70–99)
Potassium: 4.6 mmol/L (ref 3.5–5.2)
Sodium: 139 mmol/L (ref 134–144)
Total Protein: 7.8 g/dL (ref 6.0–8.5)
eGFR: 122 mL/min/{1.73_m2} (ref >59)

## 2023-02-03 LAB — CBC WITH DIFFERENTIAL/PLATELET
Basophils Absolute: 0 10*3/uL (ref 0.0–0.2)
Basos: 0 %
EOS (ABSOLUTE): 0.1 10*3/uL (ref 0.0–0.4)
Eos: 2 %
Hematocrit: 39.9 % (ref 34.0–46.6)
Hemoglobin: 13.8 g/dL (ref 11.1–15.9)
Immature Grans (Abs): 0 10*3/uL (ref 0.0–0.1)
Immature Granulocytes: 0 %
Lymphocytes Absolute: 2.4 10*3/uL (ref 0.7–3.1)
Lymphs: 33 %
MCH: 30.7 pg (ref 26.6–33.0)
MCHC: 34.6 g/dL (ref 31.5–35.7)
MCV: 89 fL (ref 79–97)
Monocytes Absolute: 0.5 10*3/uL (ref 0.1–0.9)
Monocytes: 6 %
Neutrophils Absolute: 4.2 10*3/uL (ref 1.4–7.0)
Neutrophils: 59 %
Platelets: 345 10*3/uL (ref 150–450)
RBC: 4.5 x10E6/uL (ref 3.77–5.28)
RDW: 11.5 % — ABNORMAL LOW (ref 11.7–15.4)
WBC: 7.1 10*3/uL (ref 3.4–10.8)

## 2023-02-03 NOTE — Telephone Encounter (Addendum)
Pt stated she sent the application for the orange card via email on 09/29/2022 and has not heard back.  She stated she made the office aware of a home address change and is unsure if this is why she hasn't received an update.  The address on file now is correct.   Stated has requested a callback and has not heard back.    Please advise.

## 2023-02-04 NOTE — Telephone Encounter (Signed)
Pt is calling back this morning to follow up. Stated she still has not heard back in regards to her application for the orange card.  Please advise.

## 2023-03-14 ENCOUNTER — Emergency Department (HOSPITAL_COMMUNITY)
Admission: EM | Admit: 2023-03-14 | Discharge: 2023-03-14 | Disposition: A | Discharge status: Home / Self Care | Payer: Self-pay | Attending: Emergency Medicine | Admitting: Emergency Medicine

## 2023-03-14 ENCOUNTER — Encounter (HOSPITAL_COMMUNITY): Payer: Self-pay

## 2023-03-14 ENCOUNTER — Emergency Department (HOSPITAL_COMMUNITY): Payer: Self-pay

## 2023-03-14 DIAGNOSIS — K8050 Calculus of bile duct without cholangitis or cholecystitis without obstruction: Secondary | ICD-10-CM

## 2023-03-14 DIAGNOSIS — K8020 Calculus of gallbladder without cholecystitis without obstruction: Secondary | ICD-10-CM

## 2023-03-14 DIAGNOSIS — R61 Generalized hyperhidrosis: Secondary | ICD-10-CM | POA: Insufficient documentation

## 2023-03-14 DIAGNOSIS — R1013 Epigastric pain: Secondary | ICD-10-CM | POA: Insufficient documentation

## 2023-03-14 DIAGNOSIS — R110 Nausea: Secondary | ICD-10-CM | POA: Insufficient documentation

## 2023-03-14 LAB — CBC
HCT: 39.7 % (ref 36.0–46.0)
Hemoglobin: 12.9 g/dL (ref 12.0–15.0)
MCH: 30.1 pg (ref 26.0–34.0)
MCHC: 32.5 g/dL (ref 30.0–36.0)
MCV: 92.8 fL (ref 80.0–100.0)
Platelets: 341 10*3/uL (ref 150–400)
RBC: 4.28 MIL/uL (ref 3.87–5.11)
RDW: 12 % (ref 11.5–15.5)
WBC: 9.9 10*3/uL (ref 4.0–10.5)
nRBC: 0 % (ref 0.0–0.2)

## 2023-03-14 LAB — COMPREHENSIVE METABOLIC PANEL
ALT: 58 U/L — ABNORMAL HIGH (ref 0–44)
AST: 70 U/L — ABNORMAL HIGH (ref 15–41)
Albumin: 4 g/dL (ref 3.5–5.0)
Alkaline Phosphatase: 94 U/L (ref 38–126)
Anion gap: 10 (ref 5–15)
BUN: 11 mg/dL (ref 6–20)
CO2: 23 mmol/L (ref 22–32)
Calcium: 9.1 mg/dL (ref 8.9–10.3)
Chloride: 106 mmol/L (ref 98–111)
Creatinine, Ser: 0.58 mg/dL (ref 0.44–1.00)
GFR, Estimated: >60 mL/min (ref >60)
Glucose, Bld: 114 mg/dL — ABNORMAL HIGH (ref 70–99)
Potassium: 3.1 mmol/L — ABNORMAL LOW (ref 3.5–5.1)
Sodium: 139 mmol/L (ref 135–145)
Total Bilirubin: 0.6 mg/dL (ref 0.3–1.2)
Total Protein: 8.1 g/dL (ref 6.5–8.1)

## 2023-03-14 LAB — LIPASE, BLOOD: Lipase: 44 U/L (ref 11–51)

## 2023-03-14 LAB — URINALYSIS, ROUTINE W REFLEX MICROSCOPIC
Bilirubin Urine: NEGATIVE
Glucose, UA: NEGATIVE mg/dL
Hgb urine dipstick: NEGATIVE
Ketones, ur: NEGATIVE mg/dL
Leukocytes,Ua: NEGATIVE
Nitrite: NEGATIVE
Protein, ur: 30 mg/dL — AB
Specific Gravity, Urine: 1.028 (ref 1.005–1.030)
pH: 5 (ref 5.0–8.0)

## 2023-03-14 LAB — HCG, QUANTITATIVE, PREGNANCY: hCG, Beta Chain, Quant, S: <1 m[IU]/mL (ref <5)

## 2023-03-14 MED ORDER — ONDANSETRON HCL 4 MG PO TABS: 4.0000 mg | ORAL_TABLET | Freq: Three times a day (TID) | ORAL | 0 refills | Status: DC | PRN

## 2023-03-14 MED ORDER — ONDANSETRON HCL 4 MG/2ML IJ SOLN
4.0000 mg | Freq: Once | INTRAMUSCULAR | Status: AC
  Administered 2023-03-14: 4 mg via INTRAVENOUS
  Filled 2023-03-14: qty 2

## 2023-03-14 MED ORDER — HYOSCYAMINE SULFATE SL 0.125 MG SL SUBL: 0.1250 mg | SUBLINGUAL_TABLET | SUBLINGUAL | 0 refills | Status: DC | PRN

## 2023-03-14 MED ORDER — SODIUM CHLORIDE 0.9 % IV BOLUS
1000.0000 mL | Freq: Once | INTRAVENOUS | Status: AC
  Administered 2023-03-14: 1000 mL via INTRAVENOUS

## 2023-03-14 NOTE — ED Provider Notes (Signed)
[place] EMERGENCY DEPARTMENT AT [facility] Provider Note   CSN: [id number] Arrival date & time: 03/14/23  1816     History {Add pertinent medical, surgical, social history, OB history to HPI:1} Chief Complaint  Patient presents with   Abdominal Pain    Alexa Alvarez is a 32 y.o. female [facility] emergency department chief complaint of abdominal pain.  Patient has had several episodes of severe epigastric abdominal pain with nausea and diaphoresis that comes on suddenly and lasts between 15 minutes and an hour.  She states that she had a similar episode about a month ago and was diagnosed with heartburn but does not have any daily heartburn symptoms belching.  She is complaining currently of pain this started prior to arrival in her epigastrium radiating up behind the sternum and around both upper parts of her abdomen.  She felt extremely nauseous but did not vomit.  She has previous history history of cesarean section but no other abdominal surgeries.  She is pain-free at this time without episodes of vomiting.   Abdominal Pain      Home Medications Prior to Admission medications   Medication Sig Start Date End Date Taking? Authorizing Provider  ferrous sulfate 325 (65 FE) MG tablet Take 1 tablet (325 mg total) by mouth every other day. 05/13/21   Gita Kudo, MD  norethindrone (ORTHO MICRONOR) 0.35 MG tablet Take 1 tablet (0.35 mg total) by mouth daily. 05/13/21 05/13/22  Gita Kudo, MD  omeprazole (PRILOSEC) 20 MG capsule Take 1 capsule (20 mg total) by mouth daily. 01/21/22 02/20/22  Claiborne Rigg, NP      Allergies    Patient has no known allergies.    Review of Systems   Review of Systems  Gastrointestinal:  Positive for abdominal pain.    Physical Exam Updated Vital Signs BP 114/73 (BP Location: Left Arm)   Pulse 74   Temp 98.1 F (36.7 C) (Oral)   Resp 17   Ht  (1.626 m)   Wt 79.4 kg   LMP 02/24/2023   SpO2 100%   BMI 30.04  kg/m  Physical Exam Vitals and nursing note reviewed.  Constitutional:      General: She is not in acute distress.    Appearance: She is well-developed. She is not diaphoretic.  HENT:     Head: Normocephalic and atraumatic.     Right Ear: External ear normal.     Left Ear: External ear normal.     Nose: Nose normal.     Mouth/Throat:     Mouth: Mucous membranes are moist.  Eyes:     General: No scleral icterus.    Conjunctiva/sclera: Conjunctivae normal.  Cardiovascular:     Rate and Rhythm: Normal rate and regular rhythm.     Heart sounds: Normal heart sounds. No murmur heard.    No friction rub. No gallop.  Pulmonary:     Effort: Pulmonary effort is normal. No respiratory distress.     Breath sounds: Normal breath sounds.  Abdominal:     General: Bowel sounds are normal. There is no distension.     Palpations: Abdomen is soft. There is no mass.     Tenderness: There is no abdominal tenderness. There is no guarding.  Musculoskeletal:     Cervical back: Normal range of motion.  Skin:    General: Skin is warm and dry.  Neurological:     Mental Status: She is alert and oriented to person,  place, and time.  Psychiatric:        Behavior: Behavior normal.     ED Results / Procedures / Treatments   Labs (all labs ordered are listed, but only abnormal results are displayed) Labs Reviewed  COMPREHENSIVE METABOLIC PANEL - Abnormal; Notable for the following components:      Result Value   Potassium 3.1 (*)    Glucose, Bld 114 (*)    AST 70 (*)    ALT 58 (*)    All other components within normal limits  URINALYSIS, ROUTINE W REFLEX MICROSCOPIC - Abnormal; Notable for the following components:   Color, Urine AMBER (*)    Protein, ur 30 (*)    Bacteria, UA RARE (*)    All other components within normal limits  LIPASE, BLOOD  CBC  HCG, QUANTITATIVE, PREGNANCY    EKG None  Radiology No results found.  Procedures Procedures  {Document cardiac monitor, telemetry  assessment procedure when appropriate:1}  Medications Ordered in ED Medications  sodium chloride 0.9 % bolus 1,000 mL (1,000 mLs Intravenous New Bag/Given 03/14/23 2057)  ondansetron (ZOFRAN) injection 4 mg (4 mg Intravenous Given 03/14/23 2057)    ED Course/ Medical Decision Making/ A&P Clinical Course as of 03/14/23 2210  Sun Mar 14, 2023  2209 AST(!): 70 [AH]  2209 ALT(!): 58 [AH]    Clinical Course User Index [AH] Arthor Captain, PA-C   {   Click here for ABCD2, HEART and other calculatorsREFRESH Note before signing :1}                          Medical Decision Making Amount and/or Complexity of Data Reviewed Labs: ordered. Decision-making details documented in ED Course. Radiology: ordered.  Risk Prescription drug management.   ***  {Document critical care time when appropriate:1} {Document review of labs and clinical decision tools ie heart score, Chads2Vasc2 etc:1}  {Document your independent review of radiology images, and any outside records:1} {Document your discussion with family members, caretakers, and with consultants:1} {Document social determinants of health affecting pt's care:1} {Document your decision making why or why not admission, treatments were needed:1} Final Clinical Impression(s) / ED Diagnoses Final diagnoses:  None    Rx / DC Orders ED Discharge Orders     None

## 2023-03-14 NOTE — Discharge Instructions (Signed)
Comunquese con un mdico si: El dolor le dura ms de 5 horas. Vomita. Siente escalofros o tiene fiebre. El dolor [place]. Solicite ayuda de inmediato si: Tiene un color amarillo en la piel o en las partes blancas del ojo (ictericia). Tiene la orina de color t y las heces muy claras. Se siente mareado o se desmaya.

## 2023-03-14 NOTE — ED Triage Notes (Signed)
Pt BIBA from home. Pt c/o abd and chest pain. Pt was diaphoretic on EMS arrival. Pt has hx of GERD, takes omeprazole.   81 CBG 80  100% RA 154/84  Pt received 4 mg zofran en route.

## 2023-03-22 ENCOUNTER — Ambulatory Visit (HOSPITAL_COMMUNITY)
Admission: EM | Admit: 2023-03-22 | Discharge: 2023-03-23 | Disposition: A | Discharge status: Home / Self Care | Payer: Self-pay | Attending: Emergency Medicine | Admitting: Emergency Medicine

## 2023-03-22 ENCOUNTER — Encounter (HOSPITAL_COMMUNITY): Payer: Self-pay

## 2023-03-22 DIAGNOSIS — K8012 Calculus of gallbladder with acute and chronic cholecystitis without obstruction: Secondary | ICD-10-CM | POA: Insufficient documentation

## 2023-03-22 DIAGNOSIS — K8020 Calculus of gallbladder without cholecystitis without obstruction: Secondary | ICD-10-CM

## 2023-03-22 NOTE — ED Triage Notes (Signed)
Pt states that she has been having RUQ abd pain and CP all day with nausea and SOB

## 2023-03-23 ENCOUNTER — Emergency Department (HOSPITAL_COMMUNITY): Payer: Self-pay | Admitting: Anesthesiology

## 2023-03-23 ENCOUNTER — Emergency Department (HOSPITAL_COMMUNITY): Payer: Self-pay

## 2023-03-23 ENCOUNTER — Other Ambulatory Visit: Payer: Self-pay

## 2023-03-23 ENCOUNTER — Encounter (HOSPITAL_COMMUNITY): Payer: Self-pay

## 2023-03-23 ENCOUNTER — Encounter (HOSPITAL_COMMUNITY)
Admission: EM | Disposition: A | Discharge status: Home / Self Care | Payer: Self-pay | Source: Home / Self Care | Attending: Emergency Medicine

## 2023-03-23 ENCOUNTER — Emergency Department (HOSPITAL_BASED_OUTPATIENT_CLINIC_OR_DEPARTMENT_OTHER): Payer: Self-pay | Admitting: Anesthesiology

## 2023-03-23 ENCOUNTER — Ambulatory Visit: Payer: Self-pay

## 2023-03-23 DIAGNOSIS — J45909 Unspecified asthma, uncomplicated: Secondary | ICD-10-CM

## 2023-03-23 DIAGNOSIS — Z6830 Body mass index (BMI) 30.0-30.9, adult: Secondary | ICD-10-CM

## 2023-03-23 DIAGNOSIS — K810 Acute cholecystitis: Secondary | ICD-10-CM

## 2023-03-23 DIAGNOSIS — E669 Obesity, unspecified: Secondary | ICD-10-CM

## 2023-03-23 HISTORY — PX: CHOLECYSTECTOMY: SHX55

## 2023-03-23 LAB — COMPREHENSIVE METABOLIC PANEL
ALT: 51 U/L — ABNORMAL HIGH (ref 0–44)
AST: 29 U/L (ref 15–41)
Albumin: 3.8 g/dL (ref 3.5–5.0)
Alkaline Phosphatase: 93 U/L (ref 38–126)
Anion gap: 9 (ref 5–15)
BUN: 7 mg/dL (ref 6–20)
CO2: 25 mmol/L (ref 22–32)
Calcium: 9.6 mg/dL (ref 8.9–10.3)
Chloride: 102 mmol/L (ref 98–111)
Creatinine, Ser: 0.63 mg/dL (ref 0.44–1.00)
GFR, Estimated: >60 mL/min (ref >60)
Glucose, Bld: 98 mg/dL (ref 70–99)
Potassium: 4 mmol/L (ref 3.5–5.1)
Sodium: 136 mmol/L (ref 135–145)
Total Bilirubin: 0.4 mg/dL (ref 0.3–1.2)
Total Protein: 7.4 g/dL (ref 6.5–8.1)

## 2023-03-23 LAB — CBC
HCT: 39.1 % (ref 36.0–46.0)
Hemoglobin: 12.7 g/dL (ref 12.0–15.0)
MCH: 30.2 pg (ref 26.0–34.0)
MCHC: 32.5 g/dL (ref 30.0–36.0)
MCV: 93.1 fL (ref 80.0–100.0)
Platelets: 337 10*3/uL (ref 150–400)
RBC: 4.2 MIL/uL (ref 3.87–5.11)
RDW: 12 % (ref 11.5–15.5)
WBC: 7.8 10*3/uL (ref 4.0–10.5)
nRBC: 0 % (ref 0.0–0.2)

## 2023-03-23 LAB — URINALYSIS, ROUTINE W REFLEX MICROSCOPIC
Bilirubin Urine: NEGATIVE
Glucose, UA: NEGATIVE mg/dL
Hgb urine dipstick: NEGATIVE
Ketones, ur: NEGATIVE mg/dL
Nitrite: NEGATIVE
Protein, ur: NEGATIVE mg/dL
Specific Gravity, Urine: 1.009 (ref 1.005–1.030)
pH: 6 (ref 5.0–8.0)

## 2023-03-23 LAB — LIPASE, BLOOD: Lipase: 38 U/L (ref 11–51)

## 2023-03-23 LAB — POCT PREGNANCY, URINE: Preg Test, Ur: NEGATIVE

## 2023-03-23 SURGERY — LAPAROSCOPIC CHOLECYSTECTOMY
Anesthesia: General | Site: Abdomen

## 2023-03-23 MED ORDER — ONDANSETRON HCL 4 MG/2ML IJ SOLN
INTRAMUSCULAR | Status: DC | PRN
  Administered 2023-03-23: 4 mg via INTRAVENOUS

## 2023-03-23 MED ORDER — PROPOFOL 10 MG/ML IV BOLUS
INTRAVENOUS | Status: AC
  Filled 2023-03-23: qty 20

## 2023-03-23 MED ORDER — ROCURONIUM BROMIDE 10 MG/ML (PF) SYRINGE
PREFILLED_SYRINGE | INTRAVENOUS | Status: AC
  Filled 2023-03-23: qty 10

## 2023-03-23 MED ORDER — CHLORHEXIDINE GLUCONATE 0.12 % MT SOLN: 15.0000 mL | Freq: Once | OROMUCOSAL | Status: AC

## 2023-03-23 MED ORDER — DEXAMETHASONE SODIUM PHOSPHATE 10 MG/ML IJ SOLN
INTRAMUSCULAR | Status: AC
  Filled 2023-03-23: qty 1

## 2023-03-23 MED ORDER — FENTANYL CITRATE (PF) 100 MCG/2ML IJ SOLN
25.0000 ug | INTRAMUSCULAR | Status: DC | PRN
  Administered 2023-03-23 (×2): 50 ug via INTRAVENOUS

## 2023-03-23 MED ORDER — INDOCYANINE GREEN 25 MG IV SOLR
INTRAVENOUS | Status: DC | PRN
  Administered 2023-03-23: 2.5 mg via INTRAVENOUS

## 2023-03-23 MED ORDER — MIDAZOLAM HCL 2 MG/2ML IJ SOLN
INTRAMUSCULAR | Status: AC
  Filled 2023-03-23: qty 2

## 2023-03-23 MED ORDER — LIDOCAINE 2% (20 MG/ML) 5 ML SYRINGE
INTRAMUSCULAR | Status: DC | PRN
  Administered 2023-03-23: 60 mg via INTRAVENOUS

## 2023-03-23 MED ORDER — OXYCODONE HCL 5 MG PO TABS
5.0000 mg | ORAL_TABLET | Freq: Once | ORAL | Status: AC | PRN
  Administered 2023-03-23: 5 mg via ORAL

## 2023-03-23 MED ORDER — ACETAMINOPHEN 500 MG PO TABS: 1000.0000 mg | ORAL_TABLET | Freq: Four times a day (QID) | ORAL | Status: DC | PRN

## 2023-03-23 MED ORDER — FENTANYL CITRATE (PF) 250 MCG/5ML IJ SOLN
INTRAMUSCULAR | Status: AC
  Filled 2023-03-23: qty 5

## 2023-03-23 MED ORDER — SODIUM CHLORIDE 0.9 % IV SOLN
2.0000 g | INTRAVENOUS | Status: AC
  Administered 2023-03-23: 2 g via INTRAVENOUS
  Filled 2023-03-23: qty 20

## 2023-03-23 MED ORDER — ONDANSETRON HCL 4 MG/2ML IJ SOLN
INTRAMUSCULAR | Status: AC
  Filled 2023-03-23: qty 2

## 2023-03-23 MED ORDER — CELECOXIB 200 MG PO CAPS
200.0000 mg | ORAL_CAPSULE | Freq: Once | ORAL | Status: AC
  Administered 2023-03-23: 200 mg via ORAL
  Filled 2023-03-23 (×2): qty 1

## 2023-03-23 MED ORDER — MIDAZOLAM HCL 2 MG/2ML IJ SOLN
INTRAMUSCULAR | Status: DC | PRN
  Administered 2023-03-23: 2 mg via INTRAVENOUS

## 2023-03-23 MED ORDER — LACTATED RINGERS IV SOLN: INTRAVENOUS | Status: DC

## 2023-03-23 MED ORDER — BUPIVACAINE-EPINEPHRINE (PF) 0.25% -1:200000 IJ SOLN
INTRAMUSCULAR | Status: AC
  Filled 2023-03-23: qty 30

## 2023-03-23 MED ORDER — FENTANYL CITRATE PF 50 MCG/ML IJ SOSY
50.0000 ug | PREFILLED_SYRINGE | Freq: Once | INTRAMUSCULAR | Status: AC
  Administered 2023-03-23: 50 ug via INTRAVENOUS
  Filled 2023-03-23: qty 1

## 2023-03-23 MED ORDER — FENTANYL CITRATE (PF) 250 MCG/5ML IJ SOLN
INTRAMUSCULAR | Status: DC | PRN
  Administered 2023-03-23 (×2): 50 ug via INTRAVENOUS
  Administered 2023-03-23: 100 ug via INTRAVENOUS
  Administered 2023-03-23: 50 ug via INTRAVENOUS

## 2023-03-23 MED ORDER — PHENYLEPHRINE 80 MCG/ML (10ML) SYRINGE FOR IV PUSH (FOR BLOOD PRESSURE SUPPORT)
PREFILLED_SYRINGE | INTRAVENOUS | Status: AC
  Filled 2023-03-23: qty 10

## 2023-03-23 MED ORDER — PROPOFOL 10 MG/ML IV BOLUS
INTRAVENOUS | Status: DC | PRN
  Administered 2023-03-23: 200 mg via INTRAVENOUS

## 2023-03-23 MED ORDER — OXYCODONE HCL 5 MG PO TABS
ORAL_TABLET | ORAL | Status: AC
  Filled 2023-03-23: qty 1

## 2023-03-23 MED ORDER — PROMETHAZINE HCL 25 MG/ML IJ SOLN: 6.2500 mg | INTRAMUSCULAR | Status: DC | PRN

## 2023-03-23 MED ORDER — SODIUM CHLORIDE 0.9 % IV SOLN: INTRAVENOUS | Status: DC

## 2023-03-23 MED ORDER — AMISULPRIDE (ANTIEMETIC) 5 MG/2ML IV SOLN
10.0000 mg | Freq: Once | INTRAVENOUS | Status: AC
  Administered 2023-03-23: 10 mg via INTRAVENOUS

## 2023-03-23 MED ORDER — SUGAMMADEX SODIUM 200 MG/2ML IV SOLN
INTRAVENOUS | Status: DC | PRN
  Administered 2023-03-23: 158.8 mg via INTRAVENOUS

## 2023-03-23 MED ORDER — OXYCODONE HCL 5 MG/5ML PO SOLN: 5.0000 mg | Freq: Once | ORAL | Status: AC | PRN

## 2023-03-23 MED ORDER — ORAL CARE MOUTH RINSE: 15.0000 mL | Freq: Once | OROMUCOSAL | Status: AC

## 2023-03-23 MED ORDER — BUPIVACAINE-EPINEPHRINE 0.25% -1:200000 IJ SOLN
INTRAMUSCULAR | Status: DC | PRN
  Administered 2023-03-23: 15 mL

## 2023-03-23 MED ORDER — TRAMADOL HCL 50 MG PO TABS: 50.0000 mg | ORAL_TABLET | Freq: Four times a day (QID) | ORAL | 0 refills | Status: AC | PRN

## 2023-03-23 MED ORDER — LACTATED RINGERS IV BOLUS
1000.0000 mL | Freq: Once | INTRAVENOUS | Status: AC
  Administered 2023-03-23: 1000 mL via INTRAVENOUS

## 2023-03-23 MED ORDER — ONDANSETRON HCL 4 MG/2ML IJ SOLN: 4.0000 mg | Freq: Four times a day (QID) | INTRAMUSCULAR | Status: DC | PRN

## 2023-03-23 MED ORDER — LIDOCAINE 2% (20 MG/ML) 5 ML SYRINGE
INTRAMUSCULAR | Status: AC
  Filled 2023-03-23: qty 5

## 2023-03-23 MED ORDER — CHLORHEXIDINE GLUCONATE 0.12 % MT SOLN
OROMUCOSAL | Status: AC
  Administered 2023-03-23: 15 mL via OROMUCOSAL
  Filled 2023-03-23: qty 15

## 2023-03-23 MED ORDER — AMISULPRIDE (ANTIEMETIC) 5 MG/2ML IV SOLN
INTRAVENOUS | Status: AC
  Filled 2023-03-23: qty 4

## 2023-03-23 MED ORDER — 0.9 % SODIUM CHLORIDE (POUR BTL) OPTIME
TOPICAL | Status: DC | PRN
  Administered 2023-03-23: 1000 mL

## 2023-03-23 MED ORDER — DEXAMETHASONE SODIUM PHOSPHATE 10 MG/ML IJ SOLN
INTRAMUSCULAR | Status: DC | PRN
  Administered 2023-03-23: 10 mg via INTRAVENOUS

## 2023-03-23 MED ORDER — MORPHINE SULFATE (PF) 2 MG/ML IV SOLN: 2.0000 mg | INTRAVENOUS | Status: DC | PRN

## 2023-03-23 MED ORDER — FENTANYL CITRATE (PF) 100 MCG/2ML IJ SOLN
INTRAMUSCULAR | Status: AC
  Filled 2023-03-23: qty 2

## 2023-03-23 MED ORDER — ACETAMINOPHEN 500 MG PO TABS
1000.0000 mg | ORAL_TABLET | Freq: Once | ORAL | Status: AC
  Administered 2023-03-23: 1000 mg via ORAL
  Filled 2023-03-23: qty 2

## 2023-03-23 MED ORDER — SODIUM CHLORIDE 0.9 % IR SOLN
Status: DC | PRN
  Administered 2023-03-23: 1

## 2023-03-23 MED ORDER — ROCURONIUM BROMIDE 10 MG/ML (PF) SYRINGE
PREFILLED_SYRINGE | INTRAVENOUS | Status: DC | PRN
  Administered 2023-03-23: 10 mg via INTRAVENOUS
  Administered 2023-03-23: 50 mg via INTRAVENOUS

## 2023-03-23 SURGICAL SUPPLY — 39 items
ADH SKN CLS APL DERMABOND .7 (GAUZE/BANDAGES/DRESSINGS) ×1
APL PRP STRL LF DISP 70% ISPRP (MISCELLANEOUS) ×1
APPLIER CLIP 5 13 M/L LIGAMAX5 (MISCELLANEOUS) ×1
APR CLP MED LRG 5 ANG JAW (MISCELLANEOUS) ×1
BAG COUNTER SPONGE SURGICOUNT (BAG) ×1 IMPLANT
BAG SPNG CNTER NS LX DISP (BAG) ×1
BLADE CLIPPER SURG (BLADE) IMPLANT
CANISTER SUCT 3000ML PPV (MISCELLANEOUS) ×1 IMPLANT
CHLORAPREP W/TINT 26 (MISCELLANEOUS) ×1 IMPLANT
CLIP APPLIE 5 13 M/L LIGAMAX5 (MISCELLANEOUS) ×1 IMPLANT
COVER SURGICAL LIGHT HANDLE (MISCELLANEOUS) ×1 IMPLANT
DERMABOND ADVANCED .7 DNX12 (GAUZE/BANDAGES/DRESSINGS) ×1 IMPLANT
DISSECTOR BLUNT TIP ENDO 5MM (MISCELLANEOUS) IMPLANT
ELECT REM PT RETURN 9FT ADLT (ELECTROSURGICAL) ×1
ELECTRODE REM PT RTRN 9FT ADLT (ELECTROSURGICAL) ×1 IMPLANT
GLOVE BIO SURGEON STRL SZ7.5 (GLOVE) ×1 IMPLANT
GLOVE INDICATOR 8.0 STRL GRN (GLOVE) ×1 IMPLANT
GOWN STRL REUS W/ TWL LRG LVL3 (GOWN DISPOSABLE) ×2 IMPLANT
GOWN STRL REUS W/ TWL XL LVL3 (GOWN DISPOSABLE) ×1 IMPLANT
GOWN STRL REUS W/TWL LRG LVL3 (GOWN DISPOSABLE) ×2
GOWN STRL REUS W/TWL XL LVL3 (GOWN DISPOSABLE) ×1
IRRIG SUCT STRYKERFLOW 2 WTIP (MISCELLANEOUS) ×1
IRRIGATION SUCT STRKRFLW 2 WTP (MISCELLANEOUS) ×1 IMPLANT
KIT BASIN OR (CUSTOM PROCEDURE TRAY) ×1 IMPLANT
KIT TURNOVER KIT B (KITS) ×1 IMPLANT
NS IRRIG 1000ML POUR BTL (IV SOLUTION) ×1 IMPLANT
PAD ARMBOARD 7.5X6 YLW CONV (MISCELLANEOUS) ×1 IMPLANT
PENCIL BUTTON HOLSTER BLD 10FT (ELECTRODE) ×1 IMPLANT
SCISSORS LAP 5X35 DISP (ENDOMECHANICALS) ×1 IMPLANT
SET TUBE SMOKE EVAC HIGH FLOW (TUBING) ×1 IMPLANT
SUT MNCRL AB 4-0 PS2 18 (SUTURE) ×1 IMPLANT
SYS BAG RETRIEVAL 10MM (BASKET) ×1
SYSTEM BAG RETRIEVAL 10MM (BASKET) IMPLANT
TOWEL GREEN STERILE FF (TOWEL DISPOSABLE) ×1 IMPLANT
TRAY LAPAROSCOPIC MC (CUSTOM PROCEDURE TRAY) ×1 IMPLANT
TROCAR ADV FIXATION 5X100MM (TROCAR) ×3 IMPLANT
TROCAR BALLN 12MMX100 BLUNT (TROCAR) ×1 IMPLANT
WARMER LAPAROSCOPE (MISCELLANEOUS) ×1 IMPLANT
WATER STERILE IRR 1000ML POUR (IV SOLUTION) ×1 IMPLANT

## 2023-03-23 NOTE — Anesthesia Preprocedure Evaluation (Addendum)
Anesthesia Evaluation  Patient identified by MRN, date of birth, ID band Patient awake    Reviewed: Allergy & Precautions, NPO status , Patient's Chart, lab work & pertinent test results  History of Anesthesia Complications Negative for: history of anesthetic complications  Airway Mallampati: II  TM Distance: >3 FB Neck ROM: Full    Dental no notable dental hx. (+) Dental Advisory Given   Pulmonary asthma    Pulmonary exam normal breath sounds clear to auscultation       Cardiovascular negative cardio ROS Normal cardiovascular exam Rhythm:Regular Rate:Normal     Neuro/Psych negative neurological ROS  negative psych ROS   GI/Hepatic Neg liver ROS,GERD  ,,  Endo/Other  negative endocrine ROS    Renal/GU      Musculoskeletal negative musculoskeletal ROS (+)    Abdominal  (+) + obese  Peds  Hematology negative hematology ROS (+)   Anesthesia Other Findings   Reproductive/Obstetrics negative OB ROS                              Anesthesia Physical Anesthesia Plan  ASA: 2  Anesthesia Plan: General   Post-op Pain Management: Tylenol PO (pre-op)*   Induction: Intravenous  PONV Risk Score and Plan: 3 and Treatment may vary due to age or medical condition, Ondansetron, Dexamethasone and Midazolam  Airway Management Planned: Oral ETT  Additional Equipment: None  Intra-op Plan:   Post-operative Plan: Extubation in OR  Informed Consent: I have reviewed the patients History and Physical, chart, labs and discussed the procedure including the risks, benefits and alternatives for the proposed anesthesia with the patient or authorized representative who has indicated his/her understanding and acceptance.     Dental advisory given  Plan Discussed with: CRNA  Anesthesia Plan Comments:         Anesthesia Quick Evaluation

## 2023-03-23 NOTE — Discharge Instructions (Addendum)
POST OP INSTRUCTIONS  DIET: As tolerated. Follow a light bland diet the first 24 hours after arrival home, such as soup, liquids, crackers, etc.  Be sure to include lots of fluids daily.  Avoid fast food or heavy meals as your are more likely to get nauseated.  Eat a low fat the next few days after surgery.  Take your usually prescribed home medications unless otherwise directed.  PAIN CONTROL: Pain is best controlled by a usual combination of three different methods TOGETHER: Ice/Heat Over the counter pain medication Prescription pain medication Most patients will experience some swelling and bruising around the surgical site.  Ice packs or heating pads (30-60 minutes up to 6 times a day) will help. Some people prefer to use ice alone, heat alone, alternating between ice & heat.  Experiment to what works for you.  Swelling and bruising can take several weeks to resolve.   It is helpful to take an over-the-counter pain medication regularly for the first few weeks: Ibuprofen (Motrin/Advil) - 200mg tabs - take 3 tabs (600mg) every 6 hours as needed for pain Acetaminophen (Tylenol) - you may take 650mg every 6 hours as needed. You can take this with motrin as they act differently on the body. If you are taking a narcotic pain medication that has acetaminophen in it, do not take over the counter tylenol at the same time.  Iii. NOTE: You may take both of these medications together - most patients  find it most helpful when alternating between the two (i.e. Ibuprofen at 6am,  tylenol at 9am, ibuprofen at 12pm ...) A  prescription for pain medication should be given to you upon discharge.  Take your pain medication as prescribed if your pain is not adequatly controlled with the over-the-counter pain reliefs mentioned above.  Avoid getting constipated.  Between the surgery and the pain medications, it is common to experience some constipation.  Increasing fluid intake and taking a fiber supplement (such as  Metamucil, Citrucel, FiberCon, MiraLax, etc) 1-2 times a day regularly will usually help prevent this problem from occurring.  A mild laxative (prune juice, Milk of Magnesia, MiraLax, etc) should be taken according to package directions if there are no bowel movements after 48 hours.    Dressing: Your incisions are covered in Dermabond which is like sterile superglue for the skin. This will come off on it's own in a couple weeks. It is waterproof and you may bathe normally starting the day after your surgery in a shower. Avoid baths/pools/lakes/oceans until your wounds have fully healed.  ACTIVITIES as tolerated:   Avoid heavy lifting (>10lbs or 1 gallon of milk) for the next 6 weeks. You may resume regular (light) daily activities beginning the next day--such as daily self-care, walking, climbing stairs--gradually increasing activities as tolerated.  If you can walk 30 minutes without difficulty, it is safe to try more intense activity such as jogging, treadmill, bicycling, low-impact aerobics.  DO NOT PUSH THROUGH PAIN.  Let pain be your guide: If it hurts to do something, don't do it. You may drive when you are no longer taking prescription pain medication, you can comfortably wear a seatbelt, and you can safely maneuver your car and apply brakes.   FOLLOW UP in our office Please call [facility] at [phone] to set up an appointment to see your surgeon in the office for a follow-up appointment approximately 2 weeks after your surgery. Make sure that you call for this appointment the day you arrive home to   insure a convenient appointment time.  9. If you have disability or family leave forms that need to be completed, you may have them completed by your primary care physician's office; for return to work instructions, please ask our office staff and they will be happy to assist you in obtaining this documentation   When to call us [phone]: Poor pain control Reactions / problems with new  medications (rash/itching, etc)  Fever over 101.5 F (38.5 C) Inability to urinate Nausea/vomiting Worsening swelling or bruising Continued bleeding from incision. Increased pain, redness, or drainage from the incision  The clinic staff is available to answer your questions during regular business hours (8:30am-5pm).  Please don't hesitate to call and ask to speak to one of our nurses for clinical concerns.   A surgeon from [facility] is always on call at the hospitals   If you have a medical emergency, go to the nearest emergency room or call 911.  [facility] [address], [address], [place], [place]  [address] MAIN: [phone] FAX: [phone] [email or web address] 

## 2023-03-23 NOTE — ED Notes (Signed)
Pt ambulated to restroom with steady gait and no difficulties.

## 2023-03-23 NOTE — Anesthesia Procedure Notes (Signed)
Procedure Name: Intubation Date/Time: 03/23/2023 3:36 PM  Performed by: Zollie Beckers, CRNAPre-anesthesia Checklist: Patient identified, Emergency Drugs available, Suction available and Patient being monitored Patient Re-evaluated:Patient Re-evaluated prior to induction Oxygen Delivery Method: Circle system utilized Preoxygenation: Pre-oxygenation with 100% oxygen Induction Type: IV induction Ventilation: Mask ventilation without difficulty Laryngoscope Size: Mac and 3 Grade View: Grade II Tube type: Oral Tube size: 7.5 mm Number of attempts: 1 Airway Equipment and Method: Stylet Placement Confirmation: ETT inserted through vocal cords under direct vision, positive ETCO2 and breath sounds checked- equal and bilateral Secured at: 22 cm Tube secured with: Tape Dental Injury: Teeth and Oropharynx as per pre-operative assessment

## 2023-03-23 NOTE — Transfer of Care (Signed)
Immediate Anesthesia Transfer of Care Note  Patient: Alexa Alvarez  Procedure(s) Performed: LAPAROSCOPIC CHOLECYSTECTOMY (Abdomen) INDOCYANINE GREEN FLUORESCENCE IMAGING (ICG) (Abdomen)  Patient Location: PACU  Anesthesia Type:General  Level of Consciousness: drowsy  Airway & Oxygen Therapy: Patient Spontanous Breathing and Patient connected to face mask oxygen  Post-op Assessment: Report given to RN and Post -op Vital signs reviewed and stable  Post vital signs: Reviewed and stable  Last Vitals:  Vitals Value Taken Time  BP 125/85 03/23/23 1715  Temp 37.3 C 03/23/23 1712  Pulse 93 03/23/23 1717  Resp 16 03/23/23 1717  SpO2 96 % 03/23/23 1717  Vitals shown include unvalidated device data.  Last Pain:  Vitals:   03/23/23 1712  TempSrc:   PainSc: Asleep         Complications: No notable events documented.

## 2023-03-23 NOTE — Op Note (Signed)
03/23/2023 4:59 PM  PATIENT: Alexa Alvarez  32 y.o. female  Patient Care Team: Claiborne Rigg, NP as PCP - General (Nurse Practitioner)  PRE-OPERATIVE DIAGNOSIS: Symptomatic cholelithiasis vs acute cholecystitis  POST-OPERATIVE DIAGNOSIS: Symptomatic cholelithiasis, prior cholecystitis  PROCEDURE: Laparoscopic cholecystectomy with indocyanine green cholangiography  SURGEON: Marin Olp, MD  ASSISTANT: OR staff  ANESTHESIA: General endotracheal  EBL: 15 mL  DRAINS: None  SPECIMEN: Gallbladder  COUNTS: Sponge, needle and instrument counts were reported correct x2 at the conclusion of the operation  DISPOSITION: PACU in satisfactory condition  COMPLICATIONS: None  FINDINGS: Fibrosis involving the infundibulum of the gallbladder consistent with prior cholecystitis.  Critical view of safety was achieved prior to clipping or dividing any structures.  ICG cholangiography demonstrated excretion into the biliary system with some opacification of the gallbladder.  Faint tracer seen through the wall of the duodenum.  DESCRIPTION:   The patient was identified & brought into the operating room. She was then positioned supine on the OR table. SCDs were in place and active during the entire case. She then underwent general endotracheal anesthesia. Pressure points were padded. Hair on the abdomen was clipped by the OR team. The abdomen was prepped and draped in the standard sterile fashion. Antibiotics were administered. A surgical timeout was performed and confirmed our plan.   A infraumbilical incision was made. The umbilical stalk was grasped and retracted outwardly. The infraumbilical fascia was identified and incised. The peritoneal cavity was gently entered bluntly. A purse-string 0 Vicryl suture was placed. The Hasson cannula was inserted into the peritoneal cavity and insufflation with CO2 commenced to . A laparoscope was inserted into the peritoneal cavity and  inspection confirmed no evidence of trocar site complications. The patient was then positioned in reverse Trendelenburg with slight left side down. 3 additional 5mm trocars were placed along the right subcostal line - one 5mm port in mid subcostal region, another 5mm port in the right flank near the anterior axillary line, and a third 5mm port in the left subxiphoid region obliquely near the falciform ligament.  The liver and gallbladder were inspected.  The liver is grossly normal in appearance. The gallbladder fundus was grasped and elevated cephalad.  There were adhesions consisting of omentum and anterior wall of the duodenum that are adherent to the infundibulum the gallbladder but are able to be carefully dissected away free sharply without any apparent injury to the duodenum or gallbladder.  Following this, an additional grasper was then placed on the infundibulum of the gallbladder and the infundibulum was retracted laterally. Staying high on the gallbladder, the peritoneum on both sides of the gallbladder was opened with hook cautery. Gentle blunt dissection was then employed with a Art gallery manager working down into [employer]. The cystic duct was identified and carefully circumferentially dissected. The cystic artery was also identified and carefully circumferentially dissected. The space between the cystic artery and hepatocystic plate was developed such that a good view of the liver could be seen through a window medial to the cystic artery. The triangle of Calot had been cleared of all fibrofatty tissue. At this point, a critical view of safety was achieved and the only structures visualized was the skeletonized cystic duct laterally, the skeletonized cystic artery and the liver through the window medial to the artery.  A smaller posterior cystic artery was noted  Under near-infrared light, ICG is visible within the parenchyma of the liver with excretion through the biliary system and  opacification of the cystic  duct up into the gallbladder.  There is also fate tracer noted through the wall of the duodenum consistent with a patent biliary tree.  There is no ICG activity noted within the cystic artery and its anterior or posterior division.  The cystic duct and artery were clipped with 2 clips on the patient side and 1 clip on the specimen side.  The posterior cystic artery is also controlled with 2 clips on the patient's side. The cystic duct and artery were then divided. The gallbladder was then freed from its remaining attachments to the liver using electrocautery and placed into an endocatch bag.  There was some spillage of bile during the terminal portions of removal from the liver bed near the dome.  This was controlled with suction irrigation.  The RUQ was gently irrigated with sterile saline. Hemostasis was then verified. The clips were in good position; the gallbladder fossa was dry. The rest of the abdomen was inspected no injury nor bleeding elsewhere was identified.  The endocatch bag containing the gallbladder was then removed from the umbilical port site and passed off as specimen. The RUQ ports were removed under direct visualization and noted to be hemostatic. The umbilical fascia was then closed using the 0 Vicryl purse-string suture. The fascia was palpated and noted to be completely closed. The skin of all incision sites was approximated with 4-0 monocryl subcuticular suture and dermabond applied. She was then awakened from anesthesia, extubated, and transferred to a stretcher for transport to PACU in satisfactory condition.

## 2023-03-23 NOTE — ED Provider Notes (Signed)
Care assumed from Dr. Clayborne Dana.  Patient with return visit with right upper quadrant pain and nausea and vomiting.  Concern for symptomatic cholelithiasis.  LFTs and lipase are normal.  Discussed with general surgery Percell Locus who will evaluate.  Likely cholecystectomy later today.   Glynn Octave, MD 03/23/23 1017

## 2023-03-23 NOTE — ED Notes (Signed)
Pa from general surgery consult at bedside.

## 2023-03-23 NOTE — ED Notes (Signed)
Pt transferred to US. 

## 2023-03-23 NOTE — H&P (Addendum)
Alexa Alvarez [facility] 1991-11-08  [id number].    Requesting MD: Manus Gunning, MD Chief Complaint/Reason for Consult: symptomatic cholelithiasis   HPI:  Alexa Alvarez is a 32 y/o F with PMH GERD who presents with a cc right upper quadrant pain.  Pain started last Sunday (9 days ago). Described  as sharp, intermittent RUQ pain, worse after eating. Associated with nausea and non-bloody diarrhea. Reports similar pain 3 months ago and 6 months ago. Previous pain was sharp, severe, RUQ pain that lasted for about 1 hour.   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   ROS: As above  Review of Systems  All other systems reviewed and are negative.   Family History  Problem Relation Age of Onset   Diabetes Mother    Hypertension Mother    Breast cancer Mother     Past Medical History:  Diagnosis Date   Asthma    as a child-no current problems   Chronic kidney disease    kidney stone history   GERD (gastroesophageal reflux disease)     Past Surgical History:  Procedure Laterality Date   CESAREAN SECTION     CESAREAN SECTION N/A 05/11/2021   Procedure: CESAREAN SECTION;  Surgeon: Catalina Antigua, MD;  Location: [facility] LD ORS;  Service: Obstetrics;  Laterality: N/A;  Non-reassuring fetal Heart tracing in the setting of susupected placental abruption   FINGER SURGERY Left    index finger-abcess-nail removed    Social History:  reports that she has never smoked. She has never used smokeless tobacco. She reports that she does not drink alcohol and does not use drugs.  Allergies: No Known Allergies  (Not in a hospital admission)    Physical Exam: Blood pressure 130/80, pulse 65, temperature 98.3 F (36.8 C), temperature source Oral, resp. rate  18, last menstrual period 02/24/2023, SpO2 100 %, currently breastfeeding. General: Pleasant female sitting in bed in NAD. HEENT: head -normocephalic, atraumatic; Eyes: PERRLA, no conjunctival injection; anicteric sclerae Neck- Trachea is midline CV- RRR, no lower extremity edema  Pulm- breathing is non-labored on room air Abd- soft, NT/ND, no masses, hernias, or organomegaly. GU- deferred  MSK- UE/LE symmetrical, no cyanosis, clubbing, or edema. Neuro- non-focal exam, moving all extremities, normal speech, gait not assessed  Psych- Alert and Oriented x3 with appropriate affect Skin: warm and dry, no rashes or lesions   Results for orders placed or performed during the hospital encounter of 03/22/23 (from the past 48 hour(s))  Urinalysis, Routine w reflex microscopic -Urine, Clean Catch     Status: Abnormal   Collection Time: 03/22/23 11:44 PM  Result Value Ref Range   Color, Urine YELLOW YELLOW   APPearance CLEAR CLEAR   Specific Gravity, Urine 1.009 1.005 - 1.030   pH 6.0 5.0 - 8.0   Glucose, UA NEGATIVE NEGATIVE mg/dL   Hgb urine dipstick NEGATIVE NEGATIVE   Bilirubin Urine NEGATIVE NEGATIVE   Ketones, ur NEGATIVE NEGATIVE mg/dL   Protein, ur NEGATIVE NEGATIVE mg/dL   Nitrite NEGATIVE NEGATIVE   Leukocytes,Ua TRACE (A) NEGATIVE   RBC / HPF 0-5 0 - 5 RBC/hpf   WBC, UA 0-5 0 - 5 WBC/hpf   Bacteria, UA RARE (A) NONE SEEN   Squamous Epithelial / HPF 0-5 0 - 5 /HPF   Mucus  PRESENT     Comment: Performed at [facility], [address] [place], [place] [address]  Lipase, blood     Status: None   Collection Time: 03/22/23 11:45 PM  Result Value Ref Range   Lipase 38 11 - 51 U/L    Comment: Performed at [facility], [address], [place], [place] [address]  Comprehensive metabolic panel     Status: Abnormal   Collection Time: 03/22/23 11:45 PM  Result Value Ref Range   Sodium 136 135 - 145 mmol/L   Potassium 4.0 3.5 - 5.1 mmol/L   Chloride 102 98 - 111 mmol/L    CO2 25 22 - 32 mmol/L   Glucose, Bld 98 70 - 99 mg/dL    Comment: Glucose reference range applies only to samples taken after fasting for at least 8 hours.   BUN 7 6 - 20 mg/dL   Creatinine, Ser 0.98 0.44 - 1.00 mg/dL   Calcium 9.6 8.9 - 11.9 mg/dL   Total Protein 7.4 6.5 - 8.1 g/dL   Albumin 3.8 3.5 - 5.0 g/dL   AST 29 15 - 41 U/L   ALT 51 (H) 0 - 44 U/L   Alkaline Phosphatase 93 38 - 126 U/L   Total Bilirubin 0.4 0.3 - 1.2 mg/dL   GFR, Estimated >14 >78 mL/min    Comment: (NOTE) Calculated using the CKD-EPI Creatinine Equation (2021)    Anion gap 9 5 - 15    Comment: Performed at [facility], [address] [place], [place] [address]  CBC     Status: None   Collection Time: 03/22/23 11:45 PM  Result Value Ref Range   WBC 7.8 4.0 - 10.5 K/uL   RBC 4.20 3.87 - 5.11 MIL/uL   Hemoglobin 12.7 12.0 - 15.0 g/dL   HCT 13.0 86.5 - 78.4 %   MCV 93.1 80.0 - 100.0 fL   MCH 30.2 26.0 - 34.0 pg   MCHC 32.5 30.0 - 36.0 g/dL   RDW 69.6 29.5 - 28.4 %   Platelets 337 150 - 400 K/uL   nRBC 0.0 0.0 - 0.2 %    Comment: Performed at [facility], [address], [place], [place] [address]   US Abdomen Limited RUQ (LIVER/GB)  Result Date: 03/23/2023 CLINICAL DATA:  Abdominal pain EXAM: ULTRASOUND ABDOMEN LIMITED RIGHT UPPER QUADRANT COMPARISON:  Right upper quadrant ultrasound from 9 days ago FINDINGS: Gallbladder: Extensive cholelithiasis with shadowing stones obscuring most of the gallbladder and causing wall echo shadow pattern. No focal tenderness or detected inflammation. Common bile duct: Diameter: 3 mm Liver: Echogenic liver although no diminished acoustic penetration. Portal vein is patent on color Doppler imaging with normal direction of blood flow towards the liver. Other: None. IMPRESSION: 1. Extensive cholelithiasis.  No evidence of acute cholecystitis. 2. Hepatic steatosis. Electronically Signed   By: Tiburcio Pea M.D.   On: 03/23/2023 06:15       Assessment/Plan Symptomatic cholelithiasis, likely cholecystitis  - afebrile, VSS, WBC 7.8 yesterday, LFTs WNL  - RUQ U/S with significant number of gallstones, no significant inflammatory changes - given increasing severity and frequency of symptoms I recommend laparoscopic cholecystectomy. Operative and non-operative management of gallbladder disease was discussed with the patient. The risks of surgery including bleeding, infection, damage to surrounding structures, prolonged hospital stay, and need for additional procedures/surgeries was discussed with the patient using a spanish interpreter. She would like to proceed with surgery.   FEN -  NPO, LR @ 24mL/hr VTE - SCDs ID - Rocephin on call to OR Admit - CCS service, observation   I reviewed nursing notes, ED provider notes, last 24 h vitals and pain scores, last 48 h intake and output, last 24 h labs and trends, and last 24 h imaging results.  Adam Phenix, [facility] Surgery 03/23/2023, 8:27 AM Please see Amion for pager number during day hours 7:00am-4:30pm or 7:00am -11:30am on weekends

## 2023-03-24 ENCOUNTER — Encounter (HOSPITAL_COMMUNITY): Payer: Self-pay | Admitting: Surgery

## 2023-03-24 NOTE — Anesthesia Postprocedure Evaluation (Signed)
Anesthesia Post Note  Patient: Alexa Alvarez  Procedure(s) Performed: LAPAROSCOPIC CHOLECYSTECTOMY (Abdomen) INDOCYANINE GREEN FLUORESCENCE IMAGING (ICG) (Abdomen)     Patient location during evaluation: PACU Anesthesia Type: General Level of consciousness: awake and alert Pain management: pain level controlled Vital Signs Assessment: post-procedure vital signs reviewed and stable Respiratory status: spontaneous breathing, nonlabored ventilation and respiratory function stable Cardiovascular status: stable and blood pressure returned to baseline Anesthetic complications: no   No notable events documented.  Last Vitals:  Vitals:   03/23/23 1745 03/23/23 1800  BP: 118/72 111/87  Pulse: 83 84  Resp: 14 14  Temp:  37.1 C  SpO2: 95% 100%    Last Pain:  Vitals:   03/23/23 1712  TempSrc:   PainSc: Asleep                 Alexa Alvarez

## 2023-03-25 LAB — SURGICAL PATHOLOGY

## 2023-03-26 ENCOUNTER — Ambulatory Visit: Payer: Self-pay | Attending: Nurse Practitioner

## 2023-03-31 NOTE — ED Provider Notes (Signed)
[facility] Provider Note   CSN: [id number] Arrival date & time: 03/22/23  2331     History  Chief Complaint  Patient presents with   Abdominal Pain    Alexa Alvarez is a 32 y.o. female.  RUQ pain with nausea. Had been there previously and was diagnosed with cholelithiasis. Had a fatty meal today which exacerbated symptoms. Returns here with some chest tightness with it as well. No fevers.    Abdominal Pain      Home Medications Prior to Admission medications   Medication Sig Start Date End Date Taking? Authorizing Provider  ferrous sulfate 325 (65 FE) MG tablet Take 1 tablet (325 mg total) by mouth every other day. 05/13/21   Gita Kudo, MD  Hyoscyamine Sulfate SL (LEVSIN/SL) 0.125 MG SUBL Place 1 tablet (0.125 mg total) under the tongue every 4 (four) hours as needed (pain). Up to 1.25 mg daily 03/14/23   Arthor Captain, PA-C  norethindrone (ORTHO MICRONOR) 0.35 MG tablet Take 1 tablet (0.35 mg total) by mouth daily. 05/13/21 05/13/22  Gita Kudo, MD  omeprazole (PRILOSEC) 20 MG capsule Take 1 capsule (20 mg total) by mouth daily. 01/21/22 02/20/22  Claiborne Rigg, NP  ondansetron (ZOFRAN) 4 MG tablet Take 1 tablet (4 mg total) by mouth every 8 (eight) hours as needed for nausea or vomiting. 03/14/23   Arthor Captain, PA-C      Allergies    Patient has no known allergies.    Review of Systems   Review of Systems  Gastrointestinal:  Positive for abdominal pain.    Physical Exam Updated Vital Signs BP 111/87 (BP Location: Right Arm)   Pulse 84   Temp 98.7 F (37.1 C)   Resp 14   LMP 02/24/2023   SpO2 100%  Physical Exam Vitals and nursing note reviewed.  Constitutional:      Appearance: She is well-developed.  HENT:     Head: Normocephalic and atraumatic.  Cardiovascular:     Rate and Rhythm: Normal rate and regular rhythm.  Pulmonary:     Effort: No respiratory distress.     Breath sounds: No stridor.  Abdominal:      General: There is no distension.     Tenderness: There is abdominal tenderness in the right upper quadrant.  Musculoskeletal:     Cervical back: Normal range of motion.  Neurological:     Mental Status: She is alert.     ED Results / Procedures / Treatments   Labs (all labs ordered are listed, but only abnormal results are displayed) Labs Reviewed  COMPREHENSIVE METABOLIC PANEL - Abnormal; Notable for the following components:      Result Value   ALT 51 (*)    All other components within normal limits  URINALYSIS, ROUTINE W REFLEX MICROSCOPIC - Abnormal; Notable for the following components:   Leukocytes,Ua TRACE (*)    Bacteria, UA RARE (*)    All other components within normal limits  LIPASE, BLOOD  CBC  I-STAT BETA HCG BLOOD, ED ([facility], [facility], [facility] ONLY)  POCT PREGNANCY, URINE  SURGICAL PATHOLOGY    EKG EKG Interpretation  Date/Time:  Monday March 22 2023 23:27:37 EDT Ventricular Rate:  73 PR Interval:  174 QRS Duration: 84 QT Interval:  374 QTC Calculation: 412 R Axis:   14 Text Interpretation: Normal sinus rhythm Cannot rule out Anterior infarct , age undetermined Abnormal ECG When compared with ECG of 14-Mar-2023 20:21, PREVIOUS ECG IS PRESENT No significant  change was found Confirmed by Glynn Octave [phone]) on 03/23/2023 7:18:20 AM  Radiology No results found.  Procedures Procedures    Medications Ordered in ED Medications  fentaNYL (SUBLIMAZE) injection 50 mcg (50 mcg Intravenous Given 03/23/23 0640)  lactated ringers bolus 1,000 mL (0 mLs Intravenous Stopped 03/23/23 0741)  cefTRIAXone (ROCEPHIN) 2 g in sodium chloride 0.9 % 100 mL IVPB (2 g Intravenous New Bag/Given 03/23/23 1540)  acetaminophen (TYLENOL) tablet 1,000 mg (1,000 mg Oral Given 03/23/23 1448)  celecoxib (CELEBREX) capsule 200 mg (200 mg Oral Given 03/23/23 1523)  oxyCODONE (Oxy IR/ROXICODONE) immediate release tablet 5 mg (5 mg Oral Given 03/23/23 1800)    Or  oxyCODONE (ROXICODONE) 5 MG/5ML  solution 5 mg ( Oral See Alternative 03/23/23 1800)  chlorhexidine (PERIDEX) 0.12 % solution 15 mL (15 mLs Mouth/Throat Given 03/23/23 1448)    Or  Oral care mouth rinse ( Mouth Rinse See Alternative 03/23/23 1448)  amisulpride (BARHEMSYS) injection 10 mg (10 mg Intravenous Given 03/23/23 1725)    ED Course/ Medical Decision Making/ A&P                             Medical Decision Making Amount and/or Complexity of Data Reviewed Labs: ordered. Radiology: ordered.  Risk Prescription drug management.   Symptomatic cholelithiasis vs cholecystitis.   [place] c/w extensive cholelithiasis. Will attempt symptom control, if not improving, will d/w surgery.    Care transferred pending reeval and surgery consult PRN>   Final Clinical Impression(s) / ED Diagnoses Final diagnoses:  Symptomatic cholelithiasis    Rx / DC Orders ED Discharge Orders          Ordered    traMADol (ULTRAM) 50 MG tablet  Every 6 hours PRN        03/23/23 1705              Zevin Nevares, Barbara Cower, MD 03/31/23 2325

## 2023-04-09 ENCOUNTER — Telehealth: Payer: Self-pay

## 2023-04-09 NOTE — Telephone Encounter (Signed)
Copied from CRM [id number]. Topic: Referral - Request for Referral >> Apr 09, 2023  2:39 PM Marlow Baars wrote: Has patient seen PCP for this complaint? Yes.   Referral for which specialty: Dental Preferred provider/office: [facility] [address] and the number [phone] Reason for referral: Chipped molar tooth/ pain and one other crooked tooth  Discussed a little at previous appt with provider back in March. She was told to call back with info regarding the dentist when she got the [employer].

## 2023-05-30 IMAGING — CR DG CHEST 2V
2 series · 2 of 2 positions shown · non-contrast
Comparison: 12/02/2021

CLINICAL DATA: Shortness of breath and chest pain

EXAM:
CHEST - 2 VIEW

[chest pa]
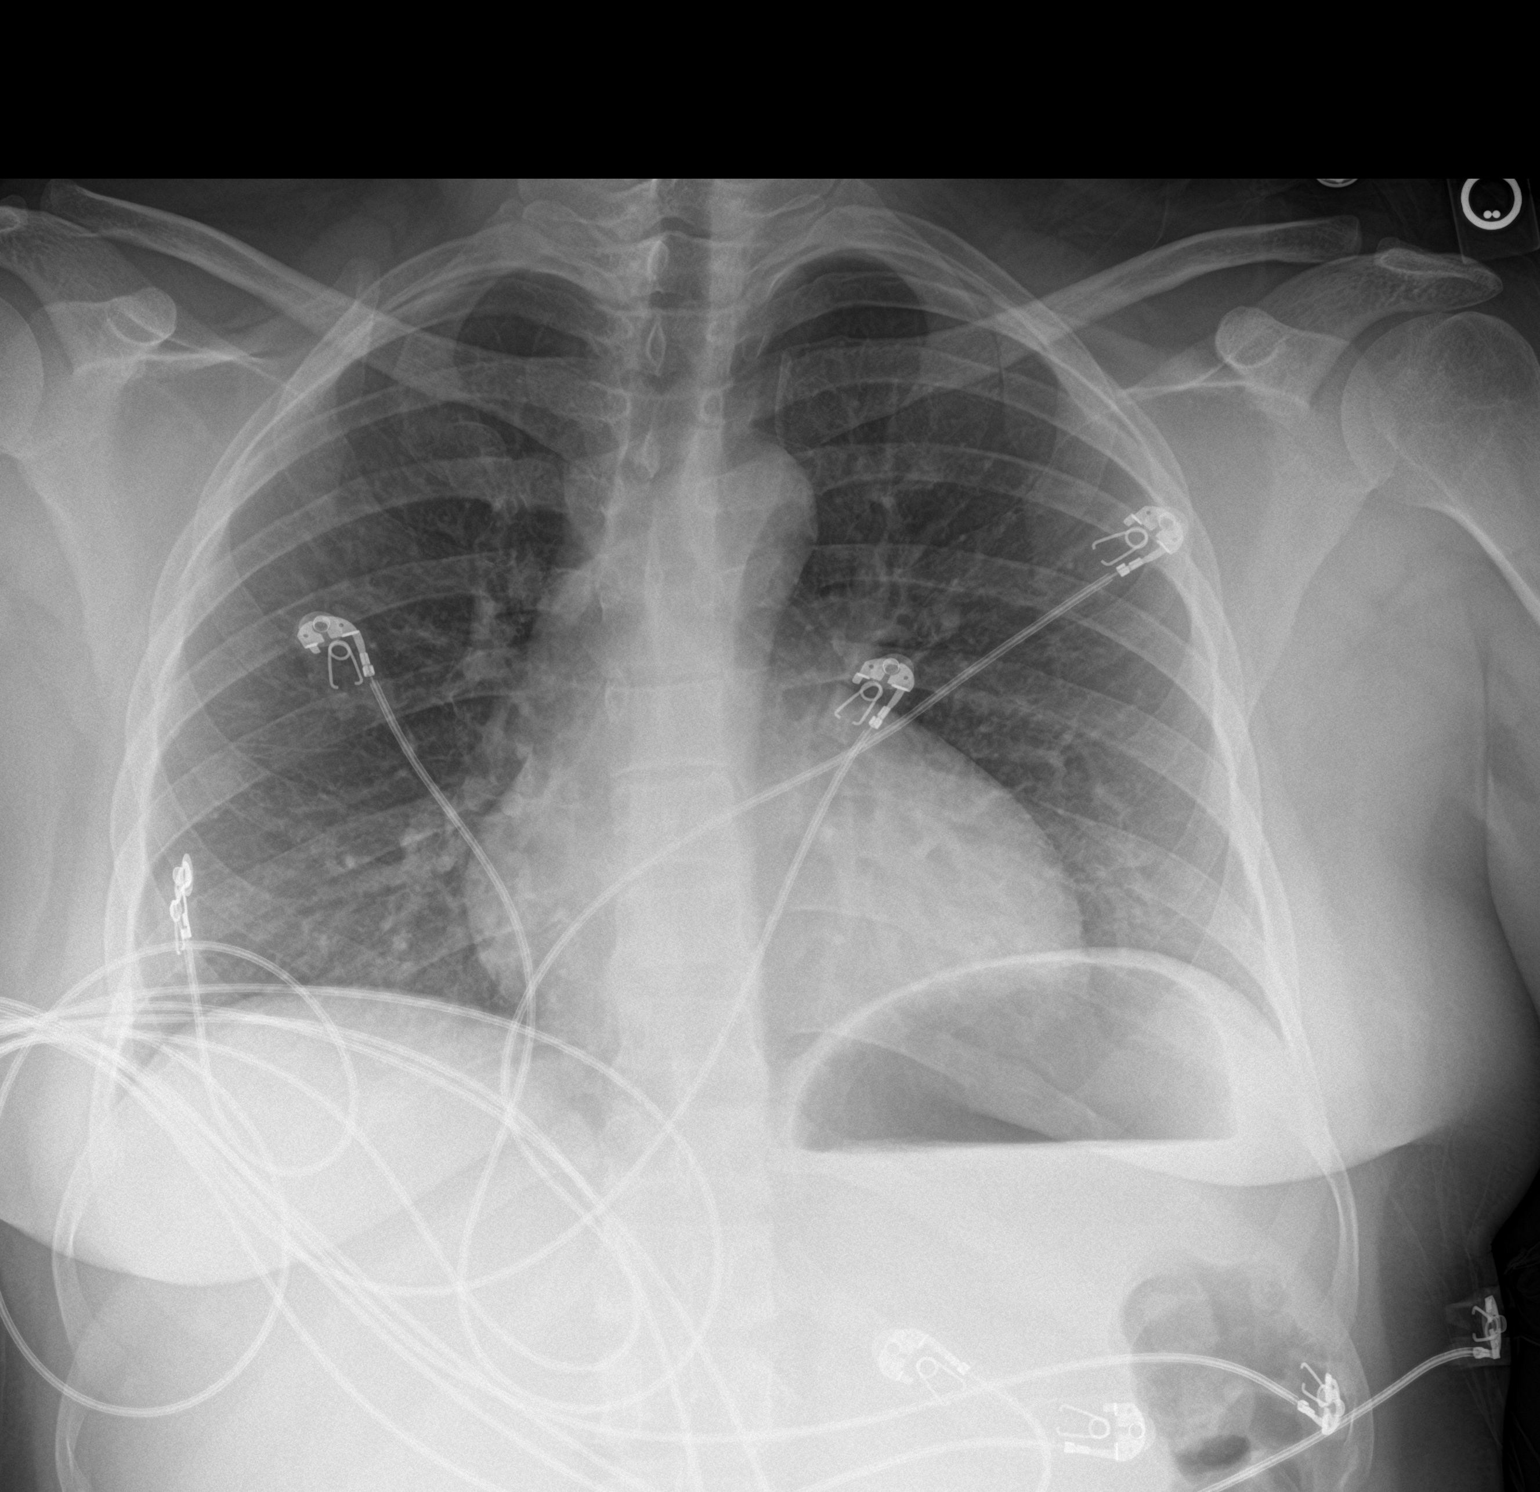

[chest lat]
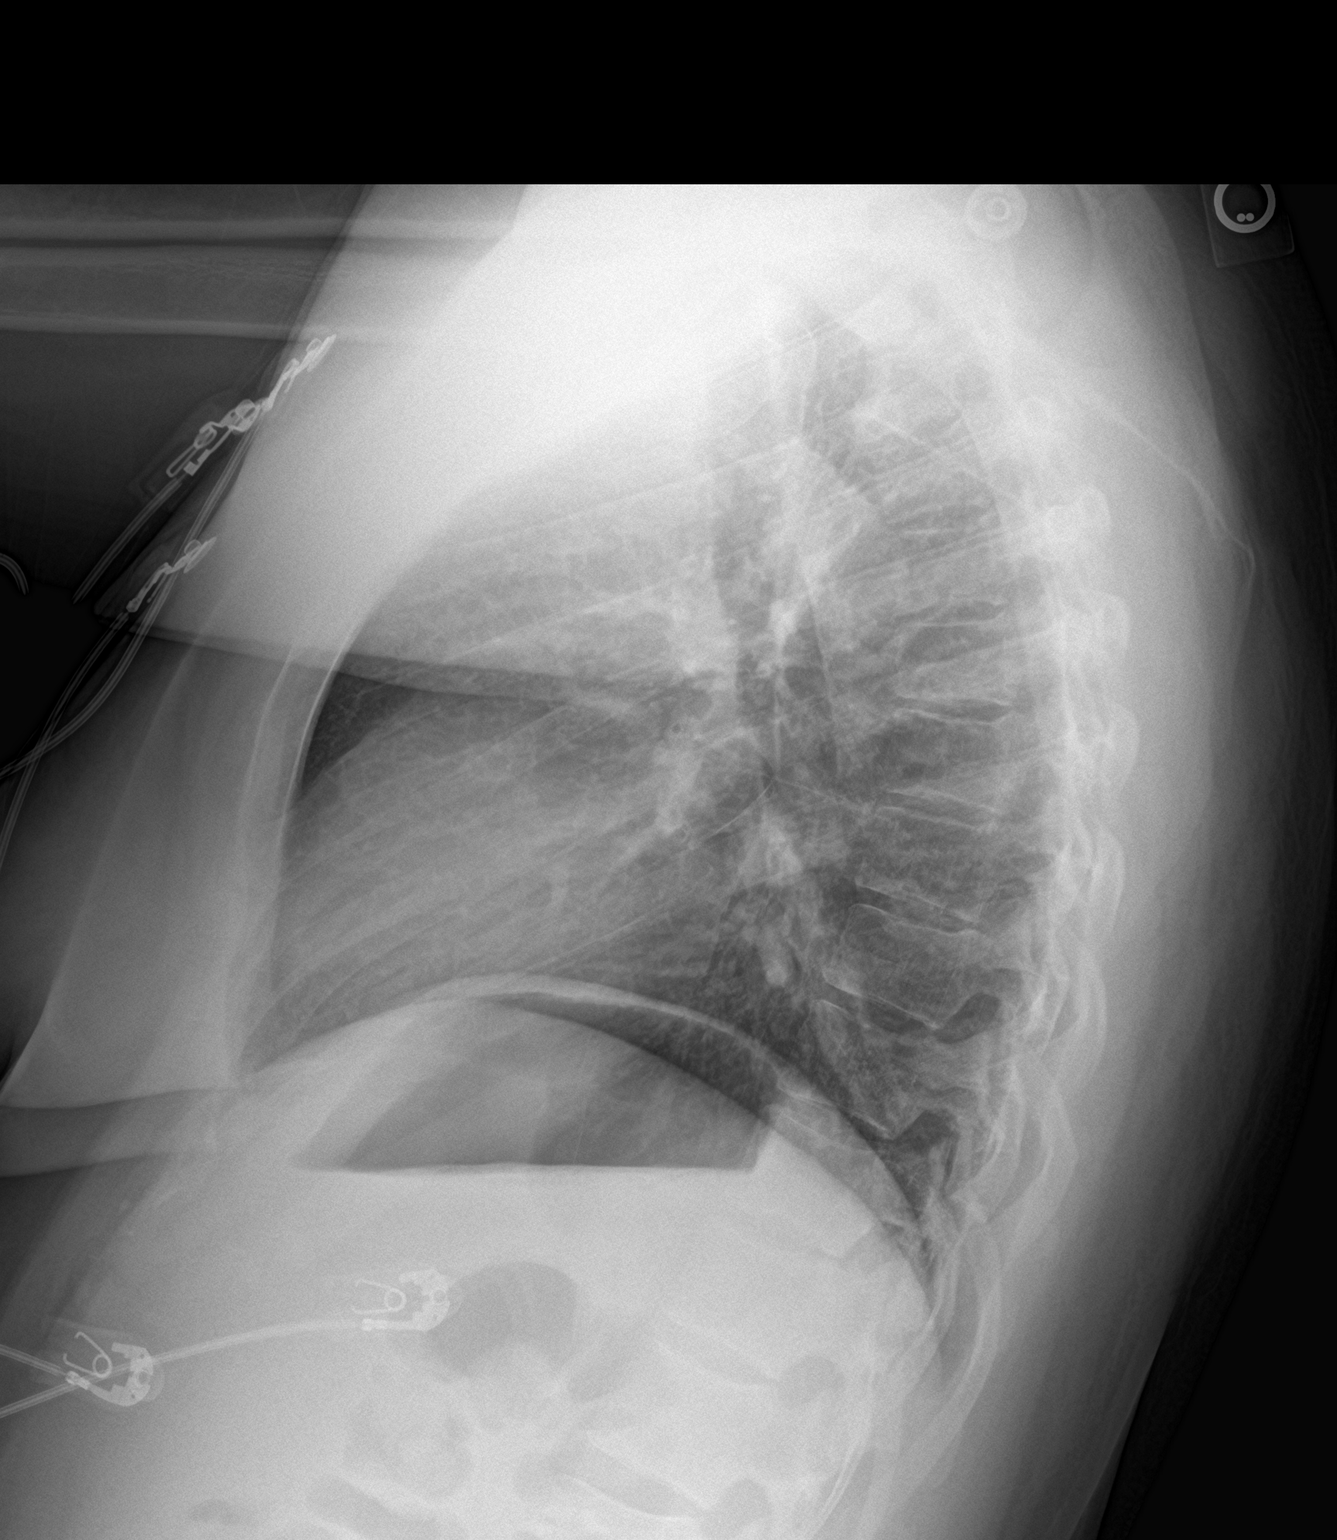

[2 of 2 positions shown; findings below may reference images not displayed]

FINDINGS: Artifact overlies the chest. Patient has taken a poor inspiration.
Question of central bronchial thickening. No infiltrate, collapse or
effusion.
IMPRESSION: Poor inspiration.  Possible bronchitis.

## 2023-07-22 NOTE — Telephone Encounter (Addendum)
Pt is calling back to f/u on request for a referral to a dentist that accepts the orange. She stated she does have the orange card.   Preferred provider/office: [facility] [address] / Number [phone]  Please advise.

## 2023-07-22 NOTE — Telephone Encounter (Signed)
Spoke with patient . Verified name & DOB   Interpreter:[id number]  Advised patient that she can call to [facility] for appointment. Referral is typically not required. Patient said she has called them and she was told she needed a referral from her doctor.

## 2023-07-23 ENCOUNTER — Other Ambulatory Visit: Payer: Self-pay | Admitting: Nurse Practitioner

## 2023-07-23 DIAGNOSIS — K089 Disorder of teeth and supporting structures, unspecified: Secondary | ICD-10-CM

## 2023-07-23 NOTE — Telephone Encounter (Signed)
REFERRAL PLACED

## 2023-09-01 ENCOUNTER — Ambulatory Visit: Payer: Self-pay | Attending: Nurse Practitioner | Admitting: Nurse Practitioner

## 2023-09-01 ENCOUNTER — Telehealth: Payer: Self-pay

## 2023-09-01 ENCOUNTER — Encounter: Payer: Self-pay | Admitting: Nurse Practitioner

## 2023-09-01 VITALS — BP 113/75 | HR 75 | Ht 64.0 in | Wt 179.0 lb

## 2023-09-01 DIAGNOSIS — R1310 Dysphagia, unspecified: Secondary | ICD-10-CM

## 2023-09-01 DIAGNOSIS — K089 Disorder of teeth and supporting structures, unspecified: Secondary | ICD-10-CM

## 2023-09-01 NOTE — Progress Notes (Signed)
Assessment & Plan:  Alexa Alvarez was seen today for dysphagia.  Diagnoses and all orders for this visit:  Dysphagia, unspecified type -     Ambulatory referral to Gastroenterology Small sips of liquids. Try to cut food into very small sections prior to eating.     Patient has been counseled on age-appropriate routine health concerns for screening and prevention. These are reviewed and up-to-date. Referrals have been placed accordingly. Immunizations are up-to-date or declined.    Subjective:   Chief Complaint  Patient presents with   Dysphagia   HPI Alexa Alvarez 32 y.o. female presents to office today with concerns of dysphagia  VRI was used to communicate directly with patient for the entire encounter including providing detailed patient instructions.    Dysphagia: Patient presents with dysphagia. The patient describes multiple episode(s) with gradual onset, located in the throat. Patient reports any solid foods and liquids have become stuck and she has to swallow several times to allow food and liquids to pass through. Associated with the dysphagia, patient also complains of no other symptoms.  Patient denies regurgitation of undigested food. Feels like her throat is too narrow.     Review of Systems  Constitutional:  Negative for fever, malaise/fatigue and weight loss.  HENT: Negative.  Negative for nosebleeds.        SEE HPI  Eyes: Negative.  Negative for blurred vision, double vision and photophobia.  Respiratory: Negative.  Negative for cough and shortness of breath.   Cardiovascular: Negative.  Negative for chest pain, palpitations and leg swelling.  Gastrointestinal: Negative.  Negative for heartburn, nausea and vomiting.  Musculoskeletal: Negative.  Negative for myalgias.  Neurological: Negative.  Negative for dizziness, focal weakness, seizures and headaches.  Psychiatric/Behavioral: Negative.  Negative for suicidal ideas.     Past Medical History:   Diagnosis Date   Asthma    as a child-no current problems   Chronic kidney disease    kidney stone history   GERD (gastroesophageal reflux disease)     Past Surgical History:  Procedure Laterality Date   CESAREAN SECTION     CESAREAN SECTION N/A 05/11/2021   Procedure: CESAREAN SECTION;  Surgeon: Catalina Antigua, MD;  Location: [facility];  Service: Obstetrics;  Laterality: N/A;  Non-reassuring fetal Heart tracing in the setting of susupected placental abruption   CHOLECYSTECTOMY N/A 03/23/2023   Procedure: LAPAROSCOPIC CHOLECYSTECTOMY;  Surgeon: Andria Meuse, MD;  Location: [facility] OR;  Service: General;  Laterality: N/A;   FINGER SURGERY Left    index finger-abcess-nail removed    Family History  Problem Relation Age of Onset   Diabetes Mother    Hypertension Mother    Breast cancer Mother     Social History Reviewed with no changes to be made today.   Outpatient Medications Prior to Visit  Medication Sig Dispense Refill   ferrous sulfate 325 (65 FE) MG tablet Take 1 tablet (325 mg total) by mouth every other day. (Patient not taking: Reported on 09/01/2023) 30 tablet 0   Hyoscyamine Sulfate SL (LEVSIN/SL) 0.125 MG SUBL Place 1 tablet (0.125 mg total) under the tongue every 4 (four) hours as needed (pain). Up to 1.25 mg daily (Patient not taking: Reported on 09/01/2023) 30 tablet 0   norethindrone (ORTHO MICRONOR) 0.35 MG tablet Take 1 tablet (0.35 mg total) by mouth daily. 28 tablet 11   omeprazole (PRILOSEC) 20 MG capsule Take 1 capsule (20 mg total) by mouth daily. 30 capsule 1   ondansetron (ZOFRAN)  4 MG tablet Take 1 tablet (4 mg total) by mouth every 8 (eight) hours as needed for nausea or vomiting. (Patient not taking: Reported on 09/01/2023) 10 tablet 0   No facility-administered medications prior to visit.    No Known Allergies     Objective:    BP 113/75 (BP Location: Left Arm, Patient Position: Sitting, Cuff Size: Large)   Pulse 75   Ht 5\' 4"  (1.626 m)   Wt  179 lb (81.2 kg)   LMP 08/13/2023 (Exact Date)   SpO2 99%   Breastfeeding No   BMI 30.73 kg/m  Wt Readings from Last 3 Encounters:  09/01/23 179 lb (81.2 kg)  03/14/23 175 lb (79.4 kg)  02/02/23 176 lb 6.4 oz (80 kg)    Physical Exam Vitals and nursing note reviewed.  Constitutional:      Appearance: She is well-developed.  HENT:     Head: Normocephalic and atraumatic.     Mouth/Throat:     Mouth: No oral lesions.     Dentition: Abnormal dentition.     Tongue: No lesions.     Palate: No mass and lesions.     Pharynx: Oropharynx is clear. Uvula midline.     Tonsils: No tonsillar abscesses.  Cardiovascular:     Rate and Rhythm: Normal rate and regular rhythm.     Heart sounds: Normal heart sounds. No murmur heard.    No friction rub. No gallop.  Pulmonary:     Effort: Pulmonary effort is normal. No tachypnea or respiratory distress.     Breath sounds: Normal breath sounds. No decreased breath sounds, wheezing, rhonchi or rales.  Chest:     Chest wall: No tenderness.  Abdominal:     General: Bowel sounds are normal.     Palpations: Abdomen is soft.  Musculoskeletal:        General: Normal range of motion.     Cervical back: Normal range of motion.  Skin:    General: Skin is warm and dry.  Neurological:     Mental Status: She is alert and oriented to person, place, and time.     Coordination: Coordination normal.  Psychiatric:        Behavior: Behavior normal. Behavior is cooperative.        Thought Content: Thought content normal.        Judgment: Judgment normal.          Patient has been counseled extensively about nutrition and exercise as well as the importance of adherence with medications and regular follow-up. The patient was given clear instructions to go to ER or return to medical center if symptoms don't improve, worsen or new problems develop. The patient verbalized understanding.   Follow-up: Return if symptoms worsen or fail to improve.   Claiborne Rigg, FNP-BC [facility] and [facility] [place], [place] [phone]   09/01/2023, 9:06 AM

## 2023-09-01 NOTE — Addendum Note (Signed)
Addended by: Arbie Cookey on: 09/01/2023 09:35 AM   Modules accepted: Orders

## 2023-09-01 NOTE — Telephone Encounter (Signed)
Copied from CRM [id number]. Topic: Referral - Request for Referral >> Aug 31, 2023 12:37 PM Everette C wrote: Has patient seen PCP for this complaint? No. *If NO, is insurance requiring patient see PCP for this issue before PCP can refer them? Referral for which specialty: Dental  Preferred provider/office: Patient has no preference but would like for the orange card to be accepted  Reason for referral: dental care

## 2023-09-07 NOTE — Telephone Encounter (Signed)
Referral sent 

## 2023-11-09 ENCOUNTER — Telehealth: Payer: Self-pay

## 2023-11-09 NOTE — Telephone Encounter (Signed)
Patient came in stating she has her new orange card and is requesting a new referral for a dentist appointment. Patient is requesting a call back.

## 2023-11-10 NOTE — Telephone Encounter (Signed)
Voicemail left with instructions

## 2024-09-12 ENCOUNTER — Ambulatory Visit: Payer: Self-pay | Attending: Nurse Practitioner | Admitting: Nurse Practitioner

## 2024-09-12 ENCOUNTER — Encounter: Payer: Self-pay | Admitting: Nurse Practitioner

## 2024-09-12 VITALS — BP 118/76 | HR 75 | Resp 19 | Ht 64.0 in | Wt 189.4 lb

## 2024-09-12 DIAGNOSIS — Z0000 Encounter for general adult medical examination without abnormal findings: Secondary | ICD-10-CM

## 2024-09-12 DIAGNOSIS — K219 Gastro-esophageal reflux disease without esophagitis: Secondary | ICD-10-CM

## 2024-09-12 DIAGNOSIS — R221 Localized swelling, mass and lump, neck: Secondary | ICD-10-CM

## 2024-09-12 MED ORDER — OMEPRAZOLE 20 MG PO CPDR
20.0000 mg | DELAYED_RELEASE_CAPSULE | Freq: Every day | ORAL | 1 refills | Status: AC
Start: 2024-09-12 — End: ?

## 2024-09-12 MED ORDER — FAMOTIDINE 40 MG PO TABS
40.0000 mg | ORAL_TABLET | Freq: Every day | ORAL | 1 refills | Status: AC
Start: 2024-09-12 — End: ?

## 2024-09-12 NOTE — Progress Notes (Signed)
 Assessment & Plan:  Alexa Alvarez was seen today for annual exam.  Diagnoses and all orders for this visit:  Encounter for annual physical exam -     CMP14+EGFR -     CBC with Differential -     Lipid panel -     Hemoglobin A1c  Neck fullness -     US  THYROID; Future -     Thyroid Panel With TSH  GERD without esophagitis -     famotidine (PEPCID) 40 MG tablet; Take 1 tablet (40 mg total) by mouth daily. FOR HEARTBURN -     omeprazole  (PRILOSEC) 20 MG capsule; Take 1 capsule (20 mg total) by mouth daily. For heartburn INSTRUCTIONS: Avoid GERD Triggers: acidic, spicy or fried foods, caffeine, coffee, sodas,  alcohol and chocolate.     Patient has been counseled on age-appropriate routine health concerns for screening and prevention. These are reviewed and up-to-date. Referrals have been placed accordingly. Immunizations are up-to-date or declined.    Subjective:   Chief Complaint  Patient presents with   Annual Exam    Alexa Alvarez 33 y.o. female presents to office today for annual physical exam.   She is concerned she may have diabetes due to sweet cravings she has been experiencing lately    Over the past several months she has been feeling a pinching sensation in the left side of her chest. Pain can sometimes last for hours and goes away on its own. She does not take any medication for this pain. She has a history of GERD and currently not taking H2 antagonist or PPI  Review of Systems  Constitutional: Negative.  Negative for chills, fever, malaise/fatigue and weight loss.  HENT: Negative.  Negative for nosebleeds.   Eyes: Negative.  Negative for blurred vision, double vision and photophobia.  Respiratory: Negative.  Negative for cough, sputum production, shortness of breath and wheezing.   Cardiovascular:  Positive for chest pain. Negative for palpitations and leg swelling.  Gastrointestinal:  Positive for heartburn. Negative for abdominal pain, blood in stool,  constipation, diarrhea, melena, nausea and vomiting.  Genitourinary: Negative.   Musculoskeletal: Negative.  Negative for myalgias.  Skin: Negative.  Negative for rash.  Neurological: Negative.  Negative for dizziness, tremors, speech change, focal weakness, seizures and headaches.  Endo/Heme/Allergies: Negative.   Psychiatric/Behavioral: Negative.  Negative for depression and suicidal ideas. The patient is not nervous/anxious and does not have insomnia.     Past Medical History:  Diagnosis Date   Asthma    as a child-no current problems   Chronic kidney disease    kidney stone history   GERD (gastroesophageal reflux disease)     Past Surgical History:  Procedure Laterality Date   CESAREAN SECTION     CESAREAN SECTION N/A 05/11/2021   Procedure: CESAREAN SECTION;  Surgeon: Alger Gong, MD;  Location: [facility];  Service: Obstetrics;  Laterality: N/A;  Non-reassuring fetal Heart tracing in the setting of susupected placental abruption   CHOLECYSTECTOMY N/A 03/23/2023   Procedure: LAPAROSCOPIC CHOLECYSTECTOMY;  Surgeon: Teresa Lonni HERO, MD;  Location: [facility] OR;  Service: General;  Laterality: N/A;   FINGER SURGERY Left    index finger-abcess-nail removed    Family History  Problem Relation Age of Onset   Diabetes Mother    Hypertension Mother    Breast cancer Mother     Social History Reviewed with no changes to be made today.   Outpatient Medications Prior to Visit  Medication Sig Dispense Refill  norethindrone  (ORTHO MICRONOR ) 0.35 MG tablet Take 1 tablet (0.35 mg total) by mouth daily. 28 tablet 11   ferrous sulfate  325 (65 FE) MG tablet Take 1 tablet (325 mg total) by mouth every other day. (Patient not taking: Reported on 09/12/2024) 30 tablet 0   Hyoscyamine  Sulfate SL (LEVSIN /SL) 0.125 MG SUBL Place 1 tablet (0.125 mg total) under the tongue every 4 (four) hours as needed (pain). Up to 1.25 mg daily (Patient not taking: Reported on 09/12/2024) 30 tablet 0    omeprazole  (PRILOSEC) 20 MG capsule Take 1 capsule (20 mg total) by mouth daily. (Patient not taking: Reported on 09/12/2024) 30 capsule 1   ondansetron  (ZOFRAN ) 4 MG tablet Take 1 tablet (4 mg total) by mouth every 8 (eight) hours as needed for nausea or vomiting. (Patient not taking: Reported on 09/12/2024) 10 tablet 0   No facility-administered medications prior to visit.    No Known Allergies     Objective:    BP 118/76 (BP Location: Left Arm, Patient Position: Sitting, Cuff Size: Normal)   Pulse 75   Resp 19   Ht 5' 4 (1.626 m)   Wt 189 lb 6.4 oz (85.9 kg)   LMP 09/08/2024 (Exact Date)   SpO2 98%   BMI 32.51 kg/m  Wt Readings from Last 3 Encounters:  09/12/24 189 lb 6.4 oz (85.9 kg)  09/01/23 179 lb (81.2 kg)  03/14/23 175 lb (79.4 kg)    Physical Exam Constitutional:      Appearance: She is well-developed.  HENT:     Head: Normocephalic and atraumatic.     Right Ear: Hearing, tympanic membrane, ear canal and external ear normal.     Left Ear: Hearing, tympanic membrane, ear canal and external ear normal.     Nose: Nose normal.     Right Turbinates: Not enlarged.     Left Turbinates: Not enlarged.     Mouth/Throat:     Lips: Pink.     Mouth: Mucous membranes are moist.     Dentition: No dental tenderness, gingival swelling, dental abscesses or gum lesions.     Pharynx: No oropharyngeal exudate.  Eyes:     General: No scleral icterus.       Right eye: No discharge.     Extraocular Movements: Extraocular movements intact.     Conjunctiva/sclera: Conjunctivae normal.     Pupils: Pupils are equal, round, and reactive to light.  Neck:     Thyroid: Thyromegaly present.     Trachea: No tracheal deviation.  Cardiovascular:     Rate and Rhythm: Normal rate and regular rhythm.     Heart sounds: Normal heart sounds. No murmur heard.    No friction rub.  Pulmonary:     Effort: Pulmonary effort is normal. No accessory muscle usage or respiratory distress.     Breath  sounds: Normal breath sounds. No decreased breath sounds, wheezing, rhonchi or rales.  Abdominal:     General: Bowel sounds are normal. There is no distension.     Palpations: Abdomen is soft. There is no mass.     Tenderness: There is no abdominal tenderness. There is no right CVA tenderness, left CVA tenderness, guarding or rebound.     Hernia: No hernia is present.  Musculoskeletal:        General: No tenderness or deformity. Normal range of motion.     Cervical back: Normal range of motion and neck supple.  Lymphadenopathy:     Cervical: No cervical adenopathy.  Skin:    General: Skin is warm and dry.     Findings: No erythema.  Neurological:     Mental Status: She is alert and oriented to person, place, and time.     Cranial Nerves: No cranial nerve deficit.     Motor: Motor function is intact.     Coordination: Coordination is intact. Coordination normal.     Gait: Gait is intact.     Deep Tendon Reflexes:     Reflex Scores:      Patellar reflexes are 1+ on the right side and 1+ on the left side. Psychiatric:        Attention and Perception: Attention normal.        Mood and Affect: Mood normal.        Speech: Speech normal.        Behavior: Behavior normal.        Thought Content: Thought content normal.        Judgment: Judgment normal.          Patient has been counseled extensively about nutrition and exercise as well as the importance of adherence with medications and regular follow-up. The patient was given clear instructions to go to ER or return to medical center if symptoms don't improve, worsen or new problems develop. The patient verbalized understanding.   Follow-up: Return if symptoms worsen or fail to improve.   Haze LELON Servant, FNP-BC [facility] and [facility] [place], [place] [phone]   09/12/2024, 10:05 AM

## 2024-09-13 LAB — CBC WITH DIFFERENTIAL/PLATELET
Basophils Absolute: 0 x10E3/uL (ref 0.0–0.2)
Basos: 0 %
EOS (ABSOLUTE): 0.1 x10E3/uL (ref 0.0–0.4)
Eos: 2 %
Hematocrit: 43.1 % (ref 34.0–46.6)
Hemoglobin: 13.9 g/dL (ref 11.1–15.9)
Immature Grans (Abs): 0 x10E3/uL (ref 0.0–0.1)
Immature Granulocytes: 0 %
Lymphocytes Absolute: 2.5 x10E3/uL (ref 0.7–3.1)
Lymphs: 38 %
MCH: 30.2 pg (ref 26.6–33.0)
MCHC: 32.3 g/dL (ref 31.5–35.7)
MCV: 94 fL (ref 79–97)
Monocytes Absolute: 0.5 x10E3/uL (ref 0.1–0.9)
Monocytes: 8 %
Neutrophils Absolute: 3.5 x10E3/uL (ref 1.4–7.0)
Neutrophils: 52 %
Platelets: 359 x10E3/uL (ref 150–450)
RBC: 4.61 x10E6/uL (ref 3.77–5.28)
RDW: 12.1 % (ref 11.7–15.4)
WBC: 6.6 x10E3/uL (ref 3.4–10.8)

## 2024-09-13 LAB — CMP14+EGFR
ALT: 157 IU/L — ABNORMAL HIGH (ref 0–32)
AST: 56 IU/L — ABNORMAL HIGH (ref 0–40)
Albumin: 4.4 g/dL (ref 3.9–4.9)
Alkaline Phosphatase: 127 IU/L — ABNORMAL HIGH (ref 41–116)
BUN/Creatinine Ratio: 14 (ref 9–23)
BUN: 8 mg/dL (ref 6–20)
Bilirubin Total: 0.5 mg/dL (ref 0.0–1.2)
CO2: 21 mmol/L (ref 20–29)
Calcium: 9.5 mg/dL (ref 8.7–10.2)
Chloride: 100 mmol/L (ref 96–106)
Creatinine, Ser: 0.58 mg/dL (ref 0.57–1.00)
Globulin, Total: 2.8 g/dL (ref 1.5–4.5)
Glucose: 93 mg/dL (ref 70–99)
Potassium: 4.2 mmol/L (ref 3.5–5.2)
Sodium: 137 mmol/L (ref 134–144)
Total Protein: 7.2 g/dL (ref 6.0–8.5)
eGFR: 123 mL/min/1.73 (ref >59)

## 2024-09-13 LAB — LIPID PANEL
Chol/HDL Ratio: 4.8 ratio — ABNORMAL HIGH (ref 0.0–4.4)
Cholesterol, Total: 193 mg/dL (ref 100–199)
HDL: 40 mg/dL (ref >39)
LDL Chol Calc (NIH): 105 mg/dL — ABNORMAL HIGH (ref 0–99)
Triglycerides: 283 mg/dL — ABNORMAL HIGH (ref 0–149)
VLDL Cholesterol Cal: 48 mg/dL — ABNORMAL HIGH (ref 5–40)

## 2024-09-13 LAB — HEMOGLOBIN A1C
Est. average glucose Bld gHb Est-mCnc: 111 mg/dL
Hgb A1c MFr Bld: 5.5 % (ref 4.8–5.6)

## 2024-09-13 LAB — THYROID PANEL WITH TSH
Free Thyroxine Index: 1.9 (ref 1.2–4.9)
T3 Uptake Ratio: 21 % — ABNORMAL LOW (ref 24–39)
T4, Total: 9 ug/dL (ref 4.5–12.0)
TSH: 0.541 u[IU]/mL (ref 0.450–4.500)

## 2024-09-14 ENCOUNTER — Ambulatory Visit: Payer: Self-pay | Admitting: Nurse Practitioner

## 2024-09-14 DIAGNOSIS — R748 Abnormal levels of other serum enzymes: Secondary | ICD-10-CM

## 2024-09-19 ENCOUNTER — Ambulatory Visit (HOSPITAL_COMMUNITY): Payer: Self-pay

## 2024-10-20 ENCOUNTER — Ambulatory Visit: Payer: Self-pay

## 2024-11-03 ENCOUNTER — Ambulatory Visit: Payer: Self-pay | Attending: Family Medicine

## 2024-11-03 DIAGNOSIS — R748 Abnormal levels of other serum enzymes: Secondary | ICD-10-CM

## 2024-11-04 LAB — HEPATIC FUNCTION PANEL
ALT: 130 IU/L — ABNORMAL HIGH (ref 0–32)
AST: 109 IU/L — ABNORMAL HIGH (ref 0–40)
Albumin: 4.5 g/dL (ref 3.9–4.9)
Alkaline Phosphatase: 121 IU/L — ABNORMAL HIGH (ref 41–116)
Bilirubin Total: 0.8 mg/dL (ref 0.0–1.2)
Bilirubin, Direct: 0.2 mg/dL (ref 0.00–0.40)
Total Protein: 7.4 g/dL (ref 6.0–8.5)

## 2024-11-05 ENCOUNTER — Ambulatory Visit: Payer: Self-pay | Admitting: Nurse Practitioner

## 2024-11-13 ENCOUNTER — Telehealth: Payer: Self-pay | Admitting: Nurse Practitioner

## 2024-11-13 NOTE — Telephone Encounter (Signed)
 Copied from CRM #[id number]. Topic: Clinical - Lab/Test Results >> Nov 13, 2024 10:46 AM Antony RAMAN wrote:  Reason for CRM: calling for lab results, [id number]

## 2024-11-13 NOTE — Telephone Encounter (Signed)
 Patient identified by name and date of birth.  Patient aware of results and voiced understanding.  Interpreter used to relay information to patient.  ID # is [id number]. Patient stated that she has been exercising and following the diet as previously advised.

## 2025-09-12 ENCOUNTER — Ambulatory Visit: Payer: Self-pay | Admitting: Nurse Practitioner
